# Patient Record
Sex: Female | Born: 1955 | Hispanic: No | Smoking: Never smoker
Health system: Southern US, Community
[De-identification: ages and names within clinical notes are randomized; demographics above are authoritative.]

## PROBLEM LIST (undated history)

## (undated) DIAGNOSIS — F32A Depression, unspecified: Secondary | ICD-10-CM

## (undated) DIAGNOSIS — E785 Hyperlipidemia, unspecified: Secondary | ICD-10-CM

## (undated) DIAGNOSIS — M797 Fibromyalgia: Secondary | ICD-10-CM

## (undated) DIAGNOSIS — E119 Type 2 diabetes mellitus without complications: Secondary | ICD-10-CM

## (undated) DIAGNOSIS — F419 Anxiety disorder, unspecified: Secondary | ICD-10-CM

## (undated) DIAGNOSIS — F329 Major depressive disorder, single episode, unspecified: Secondary | ICD-10-CM

## (undated) DIAGNOSIS — F988 Other specified behavioral and emotional disorders with onset usually occurring in childhood and adolescence: Secondary | ICD-10-CM

## (undated) DIAGNOSIS — E559 Vitamin D deficiency, unspecified: Secondary | ICD-10-CM

## (undated) HISTORY — DX: Major depressive disorder, single episode, unspecified: F32.9

## (undated) HISTORY — DX: Fibromyalgia: M79.7

## (undated) HISTORY — DX: Anxiety disorder, unspecified: F41.9

## (undated) HISTORY — PX: TUBAL LIGATION: SHX77

## (undated) HISTORY — DX: Vitamin D deficiency, unspecified: E55.9

## (undated) HISTORY — DX: Other specified behavioral and emotional disorders with onset usually occurring in childhood and adolescence: F98.8

## (undated) HISTORY — DX: Hyperlipidemia, unspecified: E78.5

## (undated) HISTORY — DX: Depression, unspecified: F32.A

---
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
## 2010-03-28 ENCOUNTER — Other Ambulatory Visit: Admission: RE | Admit: 2010-03-28 | Discharge: 2010-03-28 | Payer: Self-pay | Admitting: Obstetrics and Gynecology

## 2015-04-18 ENCOUNTER — Ambulatory Visit (HOSPITAL_COMMUNITY): Payer: Self-pay | Admitting: Psychiatry

## 2015-05-05 ENCOUNTER — Encounter (HOSPITAL_COMMUNITY): Payer: Self-pay | Admitting: Psychiatry

## 2015-05-05 ENCOUNTER — Ambulatory Visit (INDEPENDENT_AMBULATORY_CARE_PROVIDER_SITE_OTHER): Payer: 59 | Admitting: Psychiatry

## 2015-05-05 VITALS — BP 134/71 | HR 64 | Ht 65.0 in | Wt 161.0 lb

## 2015-05-05 DIAGNOSIS — F331 Major depressive disorder, recurrent, moderate: Secondary | ICD-10-CM

## 2015-05-05 DIAGNOSIS — F909 Attention-deficit hyperactivity disorder, unspecified type: Secondary | ICD-10-CM

## 2015-05-05 DIAGNOSIS — F988 Other specified behavioral and emotional disorders with onset usually occurring in childhood and adolescence: Secondary | ICD-10-CM

## 2015-05-05 MED ORDER — METHYLPHENIDATE HCL 10 MG PO TABS: 10.0000 mg | ORAL_TABLET | Freq: Two times a day (BID) | ORAL | Status: DC

## 2015-05-05 MED ORDER — DULOXETINE HCL 60 MG PO CPEP: 60.0000 mg | ORAL_CAPSULE | Freq: Every day | ORAL | Status: DC
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
## 2015-05-08 ENCOUNTER — Other Ambulatory Visit (HOSPITAL_COMMUNITY): Payer: Self-pay | Admitting: Psychiatry

## 2015-05-08 ENCOUNTER — Telehealth (HOSPITAL_COMMUNITY): Payer: Self-pay | Admitting: *Deleted

## 2015-05-08 MED ORDER — VENLAFAXINE HCL ER 75 MG PO CP24: 75.0000 mg | ORAL_CAPSULE | Freq: Every day | ORAL | Status: DC

## 2015-05-08 NOTE — Telephone Encounter (Signed)
patient states Cymbalta is $40, she would like something cheaper.

## 2015-05-08 NOTE — Telephone Encounter (Signed)
effexor XR sent in

## 2015-05-08 NOTE — Telephone Encounter (Signed)
Patient called and stated her Cymbalta is $40, she would like something cheaper. Pt number is [phone]

## 2015-05-16 ENCOUNTER — Ambulatory Visit (HOSPITAL_COMMUNITY): Payer: Self-pay | Admitting: Psychology

## 2015-06-05 ENCOUNTER — Ambulatory Visit (HOSPITAL_COMMUNITY): Payer: Self-pay | Admitting: Psychiatry

## 2015-06-28 ENCOUNTER — Ambulatory Visit (INDEPENDENT_AMBULATORY_CARE_PROVIDER_SITE_OTHER): Payer: 59 | Admitting: Psychiatry

## 2015-06-28 ENCOUNTER — Encounter (HOSPITAL_COMMUNITY): Payer: Self-pay | Admitting: Psychiatry

## 2015-06-28 VITALS — BP 155/76 | HR 81 | Ht 65.0 in | Wt 164.0 lb

## 2015-06-28 DIAGNOSIS — F988 Other specified behavioral and emotional disorders with onset usually occurring in childhood and adolescence: Secondary | ICD-10-CM

## 2015-06-28 DIAGNOSIS — F909 Attention-deficit hyperactivity disorder, unspecified type: Secondary | ICD-10-CM | POA: Diagnosis not present

## 2015-06-28 MED ORDER — METHYLPHENIDATE HCL 10 MG PO TABS: 10.0000 mg | ORAL_TABLET | Freq: Two times a day (BID) | ORAL | Status: DC

## 2015-06-28 MED ORDER — FLUOXETINE HCL 20 MG PO CAPS: 20.0000 mg | ORAL_CAPSULE | Freq: Every day | ORAL | Status: DC
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
## 2015-08-03 ENCOUNTER — Telehealth (HOSPITAL_COMMUNITY): Payer: Self-pay | Admitting: *Deleted

## 2015-08-28 ENCOUNTER — Ambulatory Visit (HOSPITAL_COMMUNITY): Payer: Self-pay | Admitting: Psychiatry

## 2015-08-29 ENCOUNTER — Ambulatory Visit (HOSPITAL_COMMUNITY): Payer: Self-pay | Admitting: Psychiatry

## 2015-09-22 ENCOUNTER — Encounter: Payer: Self-pay | Admitting: Cardiology

## 2015-09-22 ENCOUNTER — Ambulatory Visit (INDEPENDENT_AMBULATORY_CARE_PROVIDER_SITE_OTHER): Payer: Medicare HMO | Admitting: Cardiology

## 2015-09-22 VITALS — BP 139/76 | HR 81 | Ht 65.0 in | Wt 164.6 lb

## 2015-09-22 DIAGNOSIS — R011 Cardiac murmur, unspecified: Secondary | ICD-10-CM

## 2015-09-22 DIAGNOSIS — R002 Palpitations: Secondary | ICD-10-CM

## 2015-09-22 DIAGNOSIS — Q249 Congenital malformation of heart, unspecified: Secondary | ICD-10-CM

## 2015-09-22 DIAGNOSIS — R0789 Other chest pain: Secondary | ICD-10-CM | POA: Diagnosis not present

## 2015-09-22 NOTE — Progress Notes (Signed)
Cardiology Office Note  Date: 09/22/2015   ID: Beverly Contreras, DOB 12-05-1955, MRN [id number]  PCP: Glenda Chroman., MD  Consulting Cardiologist: Rozann Lesches, MD   Chief Complaint  Patient presents with  . Chest Pain    History of Present Illness: Beverly Contreras is a 60 y.o. female referred for cardiology consultation by Dr. Woody Seller.  She reports having an intermittent feeling of "jitteriness" with occasional palpitations, seems to be worse when she is still or lying down at night time. This is less notes all during the daytime. She also experiences an atypical "needlelike" sensation in her left lower chest. She has not had any syncope with these symptoms. No reproducible exertional chest discomfort. She does tell me that she has suffered a lot of loss over the last 14 months - her father, mother, and daughter have all died during this time.  I reviewed her chart. In addition to follow-up with Dr. Woody Seller, she also sees Dr. Harrington Challenger in [place] for psychiatric assessment.  We discussed her medications which are outlined below. No obvious change in regimen related to her current symptoms that she can pinpoint.  She tells me that she underwent cardiac surgery at [facility] back in 1969 related to a "hole in the heart." I do not have any other details. She states that she has had no specific follow-up for this since that time, no recent echocardiogram.  Past Medical History  Diagnosis Date  . Fibromyalgia   . Hyperlipidemia   . Vitamin D deficiency   . Depression   . Attention deficit disorder   . Anxiety     Past Surgical History  Procedure Laterality Date  . Cardiac surgery  1969    [facility], reportedly to repair "hole in heart"  . Tubal ligation      Current Outpatient Prescriptions  Medication Sig Dispense Refill  . clonazePAM (KLONOPIN) 0.5 MG tablet Take 0.5 mg by mouth as needed for anxiety.    . diclofenac (VOLTAREN) 75 MG EC tablet Take 75 mg by mouth 2 (two)  times daily.     Marland Kitchen FLUoxetine (PROZAC) 20 MG capsule Take 1 capsule (20 mg total) by mouth daily. 30 capsule 2  . HYDROcodone-acetaminophen (NORCO/VICODIN) 5-325 MG per tablet Take 1 tablet by mouth as needed.     . methylphenidate (RITALIN) 10 MG tablet Take 1 tablet (10 mg total) by mouth 2 (two) times daily with breakfast and lunch. 60 tablet 0  . tiZANidine (ZANAFLEX) 4 MG tablet Take 4 mg by mouth 2 (two) times daily.      No current facility-administered medications for this visit.   Allergies:  Levaquin   Social History: The patient  reports that she has never smoked. She does not have any smokeless tobacco history on file. She reports that she does not drink alcohol or use illicit drugs.   Family History: The patient's family history includes ADD / ADHD in her other; Alcohol abuse in her brother and brother; Bipolar disorder in her daughter; Drug abuse in her brother, brother, and other; Heart disease in her father; Hypertension in her father; Schizophrenia in her brother; Valvular heart disease in her mother.   ROS:  Please see the history of present illness. Otherwise, complete review of systems is positive for anxiety, fatigue.  All other systems are reviewed and negative.   Physical Exam: VS:  BP 139/76 mmHg  Pulse 81  Ht 5\' 5"  (1.651 m)  Wt 164 lb 9.6 oz (74.662 kg)  BMI 27.39 kg/m2  SpO2 97%, BMI Body mass index is 27.39 kg/(m^2).  Wt Readings from Last 3 Encounters:  09/22/15 164 lb 9.6 oz (74.662 kg)  06/28/15 164 lb (74.39 kg)  05/05/15 161 lb (73.029 kg)    General: Patient in no distress. HEENT: Conjunctiva and lids normal, oropharynx clear. Neck: Supple, no elevated JVP or carotid bruits, no thyromegaly. Lungs: Clear to auscultation, nonlabored breathing at rest. Thorax: T-shaped incision, well-healed Cardiac: Regular rate and rhythm, no S3, soft systolic murmur at base, no pericardial rub. Abdomen: Soft, nontender, bowel sounds present, no guarding or  rebound. Extremities: No pitting edema, distal pulses 2+. Skin: Warm and dry. Musculoskeletal: No kyphosis. Neuropsychiatric: Alert and oriented x3, affect grossly appropriate.  ECG: Tracing from 09/05/2015 showed sinus rhythm with R' in lead V1.  Assessment and Plan:  1. Reported feeling of intermittent jitteriness and palpitations, also atypical chest discomfort. Symptoms are not particularly suggestive of an ischemic etiology. She has a soft systolic murmur on examination. With prior history of reported congenital heart disease and cardiac surgery in 1969, we will obtain an echocardiogram to assess cardiac structure and function. Also 7 day event recorder to further investigate her palpitations and assess for any potential arrhythmias.  2. History of depression and attention deficit disorder. She is followed by behavioral health. Also reporting grieving over the last year with loss of several close family members.  3. Hyperlipidemia by history , currently not on any specific medical therapy. She follows with Dr. Woody Seller.  Current medicines were reviewed with the patient today.   Orders Placed This Encounter  Procedures  . Cardiac event monitor  . ECHOCARDIOGRAM COMPLETE    Disposition: Call with results.  Signed, Satira Sark, MD, [facility] 09/22/2015 9:11 AM    [facility] [place] [address], [place], [place] [address] Phone: [phone]; Fax: [phone]

## 2015-09-22 NOTE — Patient Instructions (Signed)
Your physician recommends that you continue on your current medications as directed. Please refer to the Current Medication list given to you today. Your physician has recommended that you wear a 7 day event monitor. Event monitors are medical devices that record the heart's electrical activity. Doctors most often [place] these monitors to diagnose arrhythmias. Arrhythmias are problems with the speed or rhythm of the heartbeat. The monitor is a small, portable device. You can wear one while you do your normal daily activities. This is usually used to diagnose what is causing palpitations/syncope (passing out). [facility] will contact you directly about this monitor. Your physician has requested that you have an echocardiogram. Echocardiography is a painless test that uses sound waves to create images of your heart. It provides your doctor with information about the size and shape of your heart and how well your heart's chambers and valves are working. This procedure takes approximately one hour. There are no restrictions for this procedure. We will call you with your results.

## 2015-09-27 ENCOUNTER — Ambulatory Visit (INDEPENDENT_AMBULATORY_CARE_PROVIDER_SITE_OTHER): Payer: Medicare HMO

## 2015-09-27 ENCOUNTER — Other Ambulatory Visit: Payer: Self-pay

## 2015-09-27 DIAGNOSIS — R011 Cardiac murmur, unspecified: Secondary | ICD-10-CM

## 2015-09-27 DIAGNOSIS — R002 Palpitations: Secondary | ICD-10-CM | POA: Diagnosis not present

## 2015-09-27 DIAGNOSIS — Q249 Congenital malformation of heart, unspecified: Secondary | ICD-10-CM | POA: Diagnosis not present

## 2015-09-29 ENCOUNTER — Telehealth: Payer: Self-pay | Admitting: *Deleted

## 2015-09-29 NOTE — Telephone Encounter (Signed)
-----   Message from Satira Sark, MD sent at 09/27/2015 10:57 AM EST ----- Study reviewed. I do not have records regarding her reported childhood heart surgery. 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 Her heart murmur is likely related to the aortic valve which does show some calcification and restricted leaflet motion, but no aortic stenosis and only mild aortic regurgitation. None of these findings should necessarily be symptom provoking at this time.

## 2015-10-03 NOTE — Telephone Encounter (Signed)
Patient informed. 

## 2015-10-06 ENCOUNTER — Telehealth: Payer: Self-pay | Admitting: *Deleted

## 2015-10-06 ENCOUNTER — Ambulatory Visit (HOSPITAL_COMMUNITY): Payer: Self-pay | Admitting: Psychiatry

## 2015-10-06 ENCOUNTER — Telehealth (HOSPITAL_COMMUNITY): Payer: Self-pay | Admitting: *Deleted

## 2015-10-06 NOTE — Telephone Encounter (Signed)
Called pt and lmtcb to cancel appt for 10-06-15 due to provider being out of the office sick. Number provided

## 2015-10-06 NOTE — Telephone Encounter (Signed)
-----   Message from Satira Sark, MD sent at 10/05/2015  9:59 AM EST ----- Reviewed. Please let her know that the heart monitor did not uncover any significant heart rhythm abnormalities.

## 2015-10-06 NOTE — Telephone Encounter (Signed)
Patient informed. 

## 2015-10-11 ENCOUNTER — Telehealth: Payer: Self-pay | Admitting: Cardiology

## 2015-10-11 NOTE — Telephone Encounter (Signed)
Mrs. Beverly Contreras called today stating that she is still having some CP off and on. She is wanting to know if she is suppose to  Come back and see Dr. Domenic Polite.

## 2015-10-11 NOTE — Telephone Encounter (Signed)
Spoke with patient and she said that she has not been having any symptoms of chest pain since prior to sending her event monitor back. Nurse confirmed with patient again regarding the mentioned chest pain below, and patient said that she is not having any problems at this time. Patient denies chest pain, dizziness or sob. Patient wanted to know if she could come to the office for regular follow up visits since she wasn't given a return appointment. Patient advised that she could always call for an appointment as needed and that we could also add her to the recall list if MD feels its appropriate. Patient verbalized understanding of plan.

## 2015-10-27 ENCOUNTER — Telehealth (HOSPITAL_COMMUNITY): Payer: Self-pay | Admitting: *Deleted

## 2015-10-27 ENCOUNTER — Encounter (HOSPITAL_COMMUNITY): Payer: Self-pay | Admitting: Psychiatry

## 2015-10-27 ENCOUNTER — Ambulatory Visit (INDEPENDENT_AMBULATORY_CARE_PROVIDER_SITE_OTHER): Payer: Self-pay | Admitting: Psychiatry

## 2015-10-27 VITALS — BP 123/66 | HR 82 | Ht 60.0 in | Wt 163.0 lb

## 2015-10-27 DIAGNOSIS — F331 Major depressive disorder, recurrent, moderate: Secondary | ICD-10-CM

## 2015-10-27 DIAGNOSIS — F988 Other specified behavioral and emotional disorders with onset usually occurring in childhood and adolescence: Secondary | ICD-10-CM

## 2015-10-27 DIAGNOSIS — F909 Attention-deficit hyperactivity disorder, unspecified type: Secondary | ICD-10-CM

## 2015-10-27 MED ORDER — FLUOXETINE HCL 20 MG PO CAPS: 20.0000 mg | ORAL_CAPSULE | Freq: Every day | ORAL | Status: DC

## 2015-10-27 MED ORDER — CLONAZEPAM 0.5 MG PO TABS: 0.5000 mg | ORAL_TABLET | ORAL | Status: DC | PRN

## 2015-10-27 MED ORDER — METHYLPHENIDATE HCL 10 MG PO TABS: 10.0000 mg | ORAL_TABLET | Freq: Every day | ORAL | Status: DC

## 2015-10-27 MED ORDER — METHYLPHENIDATE HCL 10 MG PO TABS: 10.0000 mg | ORAL_TABLET | Freq: Two times a day (BID) | ORAL | Status: DC
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
## 2016-01-24 ENCOUNTER — Ambulatory Visit (HOSPITAL_COMMUNITY): Payer: Self-pay | Admitting: Psychiatry

## 2016-05-31 ENCOUNTER — Other Ambulatory Visit (HOSPITAL_COMMUNITY): Payer: Self-pay | Admitting: Psychiatry

## 2016-06-03 ENCOUNTER — Telehealth (HOSPITAL_COMMUNITY): Payer: Self-pay | Admitting: *Deleted

## 2016-06-03 NOTE — Telephone Encounter (Signed)
Opened in Error.

## 2016-08-27 ENCOUNTER — Ambulatory Visit (INDEPENDENT_AMBULATORY_CARE_PROVIDER_SITE_OTHER): Payer: Self-pay | Admitting: Psychiatry

## 2016-08-27 ENCOUNTER — Encounter (HOSPITAL_COMMUNITY): Payer: Self-pay | Admitting: Psychiatry

## 2016-08-27 VITALS — BP 139/80 | HR 86 | Ht 60.0 in | Wt 167.4 lb

## 2016-08-27 DIAGNOSIS — Z813 Family history of other psychoactive substance abuse and dependence: Secondary | ICD-10-CM

## 2016-08-27 DIAGNOSIS — F900 Attention-deficit hyperactivity disorder, predominantly inattentive type: Secondary | ICD-10-CM

## 2016-08-27 DIAGNOSIS — Z9889 Other specified postprocedural states: Secondary | ICD-10-CM

## 2016-08-27 DIAGNOSIS — Z8249 Family history of ischemic heart disease and other diseases of the circulatory system: Secondary | ICD-10-CM

## 2016-08-27 DIAGNOSIS — Z818 Family history of other mental and behavioral disorders: Secondary | ICD-10-CM

## 2016-08-27 DIAGNOSIS — Z811 Family history of alcohol abuse and dependence: Secondary | ICD-10-CM

## 2016-08-27 MED ORDER — FLUOXETINE HCL 20 MG PO CAPS: 20.0000 mg | ORAL_CAPSULE | Freq: Every day | ORAL | 2 refills | Status: AC

## 2016-08-27 MED ORDER — CLONAZEPAM 0.5 MG PO TABS: 0.5000 mg | ORAL_TABLET | ORAL | 2 refills | Status: AC | PRN

## 2016-08-27 MED ORDER — METHYLPHENIDATE HCL 10 MG PO TABS: 10.0000 mg | ORAL_TABLET | Freq: Two times a day (BID) | ORAL | 0 refills | Status: DC

## 2016-08-27 MED ORDER — METHYLPHENIDATE HCL 10 MG PO TABS: 10.0000 mg | ORAL_TABLET | Freq: Two times a day (BID) | ORAL | 0 refills | Status: AC
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
## 2016-09-25 DIAGNOSIS — I10 Essential (primary) hypertension: Secondary | ICD-10-CM | POA: Diagnosis not present

## 2016-09-25 DIAGNOSIS — R69 Illness, unspecified: Secondary | ICD-10-CM | POA: Diagnosis not present

## 2016-09-25 DIAGNOSIS — J069 Acute upper respiratory infection, unspecified: Secondary | ICD-10-CM | POA: Diagnosis not present

## 2016-09-25 DIAGNOSIS — Z299 Encounter for prophylactic measures, unspecified: Secondary | ICD-10-CM | POA: Diagnosis not present

## 2016-09-25 DIAGNOSIS — M797 Fibromyalgia: Secondary | ICD-10-CM | POA: Diagnosis not present

## 2016-10-16 DIAGNOSIS — Z299 Encounter for prophylactic measures, unspecified: Secondary | ICD-10-CM | POA: Diagnosis not present

## 2016-10-16 DIAGNOSIS — Z789 Other specified health status: Secondary | ICD-10-CM | POA: Diagnosis not present

## 2016-10-16 DIAGNOSIS — J329 Chronic sinusitis, unspecified: Secondary | ICD-10-CM | POA: Diagnosis not present

## 2016-11-25 ENCOUNTER — Ambulatory Visit (HOSPITAL_COMMUNITY): Payer: Self-pay | Admitting: Psychiatry

## 2016-11-25 ENCOUNTER — Encounter (HOSPITAL_COMMUNITY): Payer: Self-pay

## 2016-11-25 ENCOUNTER — Telehealth (HOSPITAL_COMMUNITY): Payer: Self-pay | Admitting: *Deleted

## 2016-11-25 NOTE — Telephone Encounter (Signed)
Called pt to resch appt./.../or lmtcb

## 2016-12-05 DIAGNOSIS — Z1231 Encounter for screening mammogram for malignant neoplasm of breast: Secondary | ICD-10-CM | POA: Diagnosis not present

## 2017-01-08 DIAGNOSIS — Z1211 Encounter for screening for malignant neoplasm of colon: Secondary | ICD-10-CM | POA: Diagnosis not present

## 2017-01-08 DIAGNOSIS — E7800 Pure hypercholesterolemia, unspecified: Secondary | ICD-10-CM | POA: Diagnosis not present

## 2017-01-08 DIAGNOSIS — Z299 Encounter for prophylactic measures, unspecified: Secondary | ICD-10-CM | POA: Diagnosis not present

## 2017-01-08 DIAGNOSIS — M779 Enthesopathy, unspecified: Secondary | ICD-10-CM | POA: Diagnosis not present

## 2017-01-08 DIAGNOSIS — Z79899 Other long term (current) drug therapy: Secondary | ICD-10-CM | POA: Diagnosis not present

## 2017-01-08 DIAGNOSIS — Z0000 Encounter for general adult medical examination without abnormal findings: Secondary | ICD-10-CM | POA: Diagnosis not present

## 2017-01-08 DIAGNOSIS — Z7189 Other specified counseling: Secondary | ICD-10-CM | POA: Diagnosis not present

## 2017-01-08 DIAGNOSIS — R69 Illness, unspecified: Secondary | ICD-10-CM | POA: Diagnosis not present

## 2017-01-08 DIAGNOSIS — Z01419 Encounter for gynecological examination (general) (routine) without abnormal findings: Secondary | ICD-10-CM | POA: Diagnosis not present

## 2017-01-08 DIAGNOSIS — M797 Fibromyalgia: Secondary | ICD-10-CM | POA: Diagnosis not present

## 2017-01-08 DIAGNOSIS — Z1389 Encounter for screening for other disorder: Secondary | ICD-10-CM | POA: Diagnosis not present

## 2017-01-20 DIAGNOSIS — L820 Inflamed seborrheic keratosis: Secondary | ICD-10-CM | POA: Diagnosis not present

## 2017-01-20 DIAGNOSIS — L570 Actinic keratosis: Secondary | ICD-10-CM | POA: Diagnosis not present

## 2017-01-20 DIAGNOSIS — R739 Hyperglycemia, unspecified: Secondary | ICD-10-CM | POA: Diagnosis not present

## 2017-01-20 DIAGNOSIS — Z6827 Body mass index (BMI) 27.0-27.9, adult: Secondary | ICD-10-CM | POA: Diagnosis not present

## 2017-01-20 DIAGNOSIS — Z713 Dietary counseling and surveillance: Secondary | ICD-10-CM | POA: Diagnosis not present

## 2017-01-20 DIAGNOSIS — Z299 Encounter for prophylactic measures, unspecified: Secondary | ICD-10-CM | POA: Diagnosis not present

## 2017-05-25 NOTE — Progress Notes (Signed)
Cardiology Office Note  Date: 05/26/2017   ID: Beverly Contreras, DOB 11/17/1955, MRN [id number]  PCP: Glenda Chroman, MD  Primary Cardiologist: Rozann Lesches, MD   Chief Complaint  Patient presents with  . Chest Pain    History of Present Illness: Beverly Contreras is a 61 y.o. female that I met in consultation back in January 2017. She presents to the office today for follow-up stating that she has had intermittent episodes of chest pain over the last several months. She points to different places in her chest, states that sometimes it feels like a stinging pain, other times like a tightness. She has been fatigued and had some exertional chest pain in the last few weeks. Vague sense of palpitations intermittently but no syncope. She has no defined history of obstructive CAD. Some history of heart disease in the family.  Echocardiogram and event monitor from last year are outlined below. We went over the results today.  I personally reviewed her ECG today which shows sinus rhythm with R' in lead V1 and V2 as well as possible left atrial enlargement.  She reports no other major health changes and continues on the medications outlined below. She is raising a 76-year-old granddaughter, and has dealt with a lot of deaths in her family over the last 2 years.  Past Medical History:  Diagnosis Date  . Anxiety   . Attention deficit disorder   . Depression   . Fibromyalgia   . Hyperlipidemia   . Vitamin D deficiency     Past Surgical History:  Procedure Laterality Date  . [place], reportedly to repair "hole in heart"  . TUBAL LIGATION      Current Outpatient Prescriptions  Medication Sig Dispense Refill  . clonazePAM (KLONOPIN) 0.5 MG tablet Take 1 tablet (0.5 mg total) by mouth as needed for anxiety. 30 tablet 2  . diclofenac (VOLTAREN) 75 MG EC tablet Take 75 mg by mouth 2 (two) times daily.     Marland Kitchen FLUoxetine (PROZAC) 20 MG capsule Take 1 capsule (20 mg  total) by mouth daily. 30 capsule 2  . methylphenidate (RITALIN) 10 MG tablet Take 1 tablet (10 mg total) by mouth 2 (two) times daily with breakfast and lunch. 60 tablet 0  . tiZANidine (ZANAFLEX) 4 MG tablet Take 4 mg by mouth 2 (two) times daily.      No current facility-administered medications for this visit.    Allergies:  Levaquin [levofloxacin in d5w]   Social History: The patient  reports that she has never smoked. She has never used smokeless tobacco. She reports that she does not drink alcohol or use drugs.   ROS:  Please see the history of present illness. Otherwise, complete review of systems is positive for stress and anxiety.  All other systems are reviewed and negative.   Physical Exam: VS:  BP 122/80   Pulse 87   Ht 5' 5"  (1.651 m)   Wt 164 lb 6.4 oz (74.6 kg)   SpO2 98%   BMI 27.36 kg/m , BMI Body mass index is 27.36 kg/m.  Wt Readings from Last 3 Encounters:  05/26/17 164 lb 6.4 oz (74.6 kg)  09/22/15 164 lb 9.6 oz (74.7 kg)    General: Patient in no distress. HEENT: Conjunctiva and lids normal, oropharynx clear. Neck: Supple, no elevated JVP or carotid bruits, no thyromegaly. Lungs: Clear to auscultation, nonlabored breathing at rest. Thorax: T-shaped incision, well-healed Cardiac: Regular rate and rhythm,  no S3, soft systolic murmur at base, no pericardial rub. Abdomen: Soft, nontender, bowel sounds present, no guarding or rebound. Extremities: No pitting edema, distal pulses 2+. Skin: Warm and dry. Musculoskeletal: No kyphosis. Neuropsychiatric: Alert and oriented x3, affect grossly appropriate.  ECG: I personally reviewed the tracing from 09/05/2015 showed sinus rhythm with R' in lead V1.  Other Studies Reviewed Today:  Event recorder 09/28/2015: 7 day event recorder reviewed. Sinus rhythm noted throughout with intermittent lead artifact and lead loss.  Echocardiogram 09/27/2015: Study Conclusions  - Left ventricle: The cavity size was normal.  Wall thickness was   increased in a pattern of mild LVH. Systolic function was normal.   The estimated ejection fraction was in the range of 55% to 60%.   Wall motion was normal; there were no regional wall motion   abnormalities. Doppler parameters are consistent with abnormal   left ventricular relaxation (grade 1 diastolic dysfunction). - Aortic valve: Not well visualized. Probably trileaflet; mildly   calcified leaflets. There looks to be bowing and restricted   motion of the left coronary cusp, but no obvious stenosis. There   was mild regurgitation. - Mitral valve: Calcified annulus. There was trivial regurgitation. - Left atrium: The atrium was at the upper limits of normal in   size. - Right atrium: The atrium was at the upper limits of normal in   size. Central venous pressure (est): 3 mm Hg. - Atrial septum: Increased echogenicity suggesting mild thickening.   Cannot completely exclude a trivial degree of left-to-right shunt   through PFO, seen only intermittently by color Doppler. There is   no large ASD evident however. - Tricuspid valve: There was trivial regurgitation. - Pulmonary arteries: PA peak pressure: 28 mm Hg (S). - Pericardium, extracardiac: A prominent pericardial fat pad was   present.  Impressions:  - Mildly increased LV wall thickness with LVEF 55-60%. Grade 1   diastolic dysfunction with normal filling pressures. Upper normal   left and right atrial chamber size. Mitral annular calcification   with trivial mitral regurgitation. Aortic valve is not well seen   but appears to be trileaflet and mildly calcified with bowing and   some restrictive motion of the left coronary cusp. No aortic   stenosis. Mild aortic regurgitation. There is thickening noted of   the interatrial septum with perhaps a trivial degree of   left-to-right shunting by PFO, but no obvious ASD. PASP is normal   at 28 mmHg.  Assessment and Plan:  1. Atypical chest pain as well as  fatigue and at times exertional chest discomfort. She has no defined history of obstructive CAD. ECG is nonspecific as outlined above. Cardiac risk factors include hyperlipidemia and family history of CAD. We will obtain an exercise Myoview for further evaluation.  2. History of apparent ASD repair in the past. Follow-up echocardiogram last year demonstrated thickening of the interatrial septum with a trivial degree of left-to-right shunting in region of PFO. Right ventricular contraction was normal as well as PASP.  3. History of palpitations, no syncope. Cardiac monitor from last year did not demonstrate any arrhythmias. Could also be associated with use of Ritalin.  4. History of hyperlipidemia, managed by diet. She follows with Dr. Woody Seller.  Current medicines were reviewed with the patient today.   Orders Placed This Encounter  Procedures  . NM Myocar Multi W/Spect W/Wall Motion / EF  . EKG 12-Lead    Disposition: Call with test results.  Signed, Satira Sark, MD,  [facility] 05/26/2017 1:30 PM    [facility] at [place], [place], [place] [address] Phone: [phone]; Fax: [phone]

## 2017-05-26 ENCOUNTER — Encounter: Payer: Self-pay | Admitting: *Deleted

## 2017-05-26 ENCOUNTER — Encounter: Payer: Self-pay | Admitting: Cardiology

## 2017-05-26 ENCOUNTER — Ambulatory Visit (INDEPENDENT_AMBULATORY_CARE_PROVIDER_SITE_OTHER): Payer: Medicare HMO | Admitting: Cardiology

## 2017-05-26 VITALS — BP 122/80 | HR 87 | Ht 65.0 in | Wt 164.4 lb

## 2017-05-26 DIAGNOSIS — R002 Palpitations: Secondary | ICD-10-CM

## 2017-05-26 DIAGNOSIS — R0789 Other chest pain: Secondary | ICD-10-CM | POA: Diagnosis not present

## 2017-05-26 DIAGNOSIS — Q2112 Patent foramen ovale: Secondary | ICD-10-CM

## 2017-05-26 DIAGNOSIS — E782 Mixed hyperlipidemia: Secondary | ICD-10-CM | POA: Diagnosis not present

## 2017-05-26 DIAGNOSIS — Q211 Atrial septal defect: Secondary | ICD-10-CM

## 2017-05-26 NOTE — Patient Instructions (Signed)
Your physician recommends that you schedule a follow-up appointment TO BE DETERMINED WITH DR Ar[place]  Your physician recommends that you continue on your current medications as directed. Please refer to the Current Medication list given to you today.  Your physician has requested that you have en exercise stress myoview. For further information please visit [email or web address]. Please follow instruction sheet, as given.  Thank you for choosing [facility]!!

## 2017-05-28 ENCOUNTER — Telehealth: Payer: Self-pay | Admitting: Cardiology

## 2017-05-28 NOTE — Telephone Encounter (Signed)
Pre-cert Verification for the following procedure   Exercise Myoview scheduled for 06/03/2017

## 2017-06-03 ENCOUNTER — Inpatient Hospital Stay (HOSPITAL_COMMUNITY): Admission: RE | Admit: 2017-06-03 | Payer: Self-pay | Source: Ambulatory Visit

## 2017-06-03 ENCOUNTER — Encounter (HOSPITAL_COMMUNITY): Payer: Medicare HMO

## 2017-06-09 ENCOUNTER — Encounter (HOSPITAL_BASED_OUTPATIENT_CLINIC_OR_DEPARTMENT_OTHER)
Admission: RE | Admit: 2017-06-09 | Discharge: 2017-06-09 | Disposition: A | Discharge status: Home / Self Care | Payer: Medicare HMO | Source: Ambulatory Visit | Attending: Cardiology | Admitting: Cardiology

## 2017-06-09 ENCOUNTER — Encounter (HOSPITAL_COMMUNITY)
Admission: RE | Admit: 2017-06-09 | Discharge: 2017-06-09 | Disposition: A | Discharge status: Home / Self Care | Payer: Medicare HMO | Source: Ambulatory Visit | Attending: Cardiology | Admitting: Cardiology

## 2017-06-09 ENCOUNTER — Encounter (HOSPITAL_COMMUNITY): Payer: Self-pay

## 2017-06-09 DIAGNOSIS — W01198A Fall on same level from slipping, tripping and stumbling with subsequent striking against other object, initial encounter: Secondary | ICD-10-CM | POA: Diagnosis not present

## 2017-06-09 DIAGNOSIS — M79674 Pain in right toe(s): Secondary | ICD-10-CM | POA: Diagnosis not present

## 2017-06-09 DIAGNOSIS — S99921A Unspecified injury of right foot, initial encounter: Secondary | ICD-10-CM | POA: Diagnosis not present

## 2017-06-09 DIAGNOSIS — R0789 Other chest pain: Secondary | ICD-10-CM

## 2017-06-09 DIAGNOSIS — S92355A Nondisplaced fracture of fifth metatarsal bone, left foot, initial encounter for closed fracture: Secondary | ICD-10-CM | POA: Diagnosis not present

## 2017-06-09 DIAGNOSIS — W19XXXA Unspecified fall, initial encounter: Secondary | ICD-10-CM | POA: Diagnosis not present

## 2017-06-09 DIAGNOSIS — R69 Illness, unspecified: Secondary | ICD-10-CM | POA: Diagnosis not present

## 2017-06-09 DIAGNOSIS — M797 Fibromyalgia: Secondary | ICD-10-CM | POA: Diagnosis not present

## 2017-06-09 DIAGNOSIS — Z79899 Other long term (current) drug therapy: Secondary | ICD-10-CM | POA: Diagnosis not present

## 2017-06-09 LAB — NM MYOCAR MULTI W/SPECT W/WALL MOTION / EF
CHL CUP RESTING HR STRESS: 80 {beats}/min
CHL RATE OF PERCEIVED EXERTION: 13
CSEPEDS: 36 s
Estimated workload: 7 METS
Exercise duration (min): 6 min
LVDIAVOL: 49 mL (ref 46–106)
LVSYSVOL: 19 mL
MPHR: 158 {beats}/min
NUC STRESS TID: 0.95
Peak HR: 137 {beats}/min
Percent HR: 86 %
RATE: 0.39
SDS: 4
SRS: 1
SSS: 5

## 2017-06-09 MED ORDER — REGADENOSON 0.4 MG/5ML IV SOLN
INTRAVENOUS | Status: AC
  Filled 2017-06-09: qty 5

## 2017-06-09 MED ORDER — SODIUM CHLORIDE 0.9% FLUSH
INTRAVENOUS | Status: AC
  Administered 2017-06-09: 10 mL via INTRAVENOUS
  Filled 2017-06-09: qty 10

## 2017-06-09 MED ORDER — TECHNETIUM TC 99M TETROFOSMIN IV KIT
30.0000 | PACK | Freq: Once | INTRAVENOUS | Status: AC | PRN
  Administered 2017-06-09: 30 via INTRAVENOUS

## 2017-06-09 MED ORDER — TECHNETIUM TC 99M TETROFOSMIN IV KIT
10.0000 | PACK | Freq: Once | INTRAVENOUS | Status: AC | PRN
  Administered 2017-06-09: 10 via INTRAVENOUS

## 2017-06-10 ENCOUNTER — Telehealth: Payer: Self-pay

## 2017-06-10 NOTE — Telephone Encounter (Signed)
Patient notified and verbalized understanding. Appointment scheduled for Friday 9/27. Routed to PCP

## 2017-06-10 NOTE — Telephone Encounter (Signed)
-----   Message from Acquanetta Chain, LPN sent at 7/97/2820  7:53 AM EDT -----   ----- Message ----- From: Satira Sark, MD Sent: 06/09/2017   3:16 PM To: Merlene Laughter, LPN  Results reviewed. I reviewed the images as well. Stress testing demonstrated abnormal ST segment changes by ECG and also a region of inferolateral ischemia, normal LVEF. In light of her symptoms and these findings, recommend having her follow-up in the office to discuss diagnostic cardiac catheterization to best clarify her coronary anatomy. Schedule an APP visit or visit with me, whichever is sooner. A copy of this test should be forwarded to Glenda Chroman, MD.

## 2017-06-10 NOTE — Telephone Encounter (Signed)
LMTCB

## 2017-06-10 NOTE — Telephone Encounter (Signed)
-----   Message from Acquanetta Chain, LPN sent at 02/07/8184  7:53 AM EDT -----   ----- Message ----- From: Satira Sark, MD Sent: 06/09/2017   3:16 PM To: Merlene Laughter, LPN  Results reviewed. I reviewed the images as well. Stress testing demonstrated abnormal ST segment changes by ECG and also a region of inferolateral ischemia, normal LVEF. In light of her symptoms and these findings, recommend having her follow-up in the office to discuss diagnostic cardiac catheterization to best clarify her coronary anatomy. Schedule an APP visit or visit with me, whichever is sooner. A copy of this test should be forwarded to Glenda Chroman, MD.

## 2017-06-12 ENCOUNTER — Encounter: Payer: Self-pay | Admitting: *Deleted

## 2017-06-12 DIAGNOSIS — E7800 Pure hypercholesterolemia, unspecified: Secondary | ICD-10-CM | POA: Diagnosis not present

## 2017-06-12 DIAGNOSIS — Z6827 Body mass index (BMI) 27.0-27.9, adult: Secondary | ICD-10-CM | POA: Diagnosis not present

## 2017-06-12 DIAGNOSIS — M797 Fibromyalgia: Secondary | ICD-10-CM | POA: Diagnosis not present

## 2017-06-12 DIAGNOSIS — R69 Illness, unspecified: Secondary | ICD-10-CM | POA: Diagnosis not present

## 2017-06-12 DIAGNOSIS — I10 Essential (primary) hypertension: Secondary | ICD-10-CM | POA: Diagnosis not present

## 2017-06-12 DIAGNOSIS — Z299 Encounter for prophylactic measures, unspecified: Secondary | ICD-10-CM | POA: Diagnosis not present

## 2017-06-12 DIAGNOSIS — S92355A Nondisplaced fracture of fifth metatarsal bone, left foot, initial encounter for closed fracture: Secondary | ICD-10-CM | POA: Diagnosis not present

## 2017-06-13 ENCOUNTER — Ambulatory Visit (INDEPENDENT_AMBULATORY_CARE_PROVIDER_SITE_OTHER): Payer: Medicare HMO | Admitting: Cardiology

## 2017-06-13 ENCOUNTER — Telehealth: Payer: Self-pay | Admitting: Cardiology

## 2017-06-13 ENCOUNTER — Other Ambulatory Visit: Payer: Self-pay | Admitting: Cardiology

## 2017-06-13 ENCOUNTER — Encounter: Payer: Self-pay | Admitting: Cardiology

## 2017-06-13 ENCOUNTER — Encounter: Payer: Self-pay | Admitting: *Deleted

## 2017-06-13 VITALS — BP 132/78 | HR 87 | Ht 65.0 in | Wt 165.6 lb

## 2017-06-13 DIAGNOSIS — E782 Mixed hyperlipidemia: Secondary | ICD-10-CM

## 2017-06-13 DIAGNOSIS — R9439 Abnormal result of other cardiovascular function study: Secondary | ICD-10-CM | POA: Diagnosis not present

## 2017-06-13 DIAGNOSIS — R002 Palpitations: Secondary | ICD-10-CM | POA: Diagnosis not present

## 2017-06-13 DIAGNOSIS — Z8774 Personal history of (corrected) congenital malformations of heart and circulatory system: Secondary | ICD-10-CM | POA: Diagnosis not present

## 2017-06-13 DIAGNOSIS — R0789 Other chest pain: Secondary | ICD-10-CM | POA: Diagnosis not present

## 2017-06-13 NOTE — Progress Notes (Signed)
Cardiology Office Note  Date: 06/13/2017   ID: Beverly Contreras, DOB Jun 25, 1956, MRN [id number]  PCP: Glenda Chroman, MD  Primary Cardiologist: Rozann Lesches, MD   Chief Complaint  Patient presents with  . Follow-up testing    History of Present Illness: Beverly Contreras is a 61 y.o. female seen recently on September 10 and referred for follow-up stress testing. She has had fairly atypical chest pain that continues to recur. No definite trigger that she can identify. Also a vague sense of palpitations but no dizziness or syncope. She has a history of heart disease in the family.  Myoview study demonstrated abnormal ST segment changes and a region of inferolateral ischemia with overall normal LVEF. We discussed the results today.  In light of recurring, although atypical chest pain, we discussed arranging a diagnostic cardiac catheterization to best clarify her coronary anatomy. We reviewed the risks and benefits, and she is in agreement to proceed.  I reviewed her medications which are outlined below.  Past Medical History:  Diagnosis Date  . Anxiety   . Attention deficit disorder   . Depression   . Fibromyalgia   . Hyperlipidemia   . Vitamin D deficiency     Past Surgical History:  Procedure Laterality Date  . [place], reportedly to repair "hole in heart"  . TUBAL LIGATION      Current Outpatient Prescriptions  Medication Sig Dispense Refill  . clonazePAM (KLONOPIN) 0.5 MG tablet Take 1 tablet (0.5 mg total) by mouth as needed for anxiety. 30 tablet 2  . diclofenac (VOLTAREN) 75 MG EC tablet Take 75 mg by mouth 2 (two) times daily.     Marland Kitchen FLUoxetine (PROZAC) 20 MG capsule Take 1 capsule (20 mg total) by mouth daily. 30 capsule 2  . methylphenidate (RITALIN) 10 MG tablet Take 1 tablet (10 mg total) by mouth 2 (two) times daily with breakfast and lunch. 60 tablet 0  . tiZANidine (ZANAFLEX) 4 MG tablet Take 4 mg by mouth 2 (two) times daily.       No current facility-administered medications for this visit.    Allergies:  Levaquin [levofloxacin in d5w]   Social History: The patient  reports that she has never smoked. She has never used smokeless tobacco. She reports that she does not drink alcohol or use drugs.   Family History: The patient's family history includes ADD / ADHD in her other; Alcohol abuse in her brother and brother; Bipolar disorder in her daughter; Drug abuse in her brother, brother, and other; Heart disease in her father; Hypertension in her father; Schizophrenia in her brother; Valvular heart disease in her mother.   ROS:  Please see the history of present illness. Otherwise, complete review of systems is positive for anxiety, fatigue, recent accidental fall with resulting small bone fracture in her left foot. All other systems are reviewed and negative.   Physical Exam: VS:  BP 132/78   Pulse 87   Ht 5\' 5"  (1.651 m)   Wt 165 lb 9.6 oz (75.1 kg)   SpO2 97%   BMI 27.56 kg/m , BMI Body mass index is 27.56 kg/m.  Wt Readings from Last 3 Encounters:  06/13/17 165 lb 9.6 oz (75.1 kg)  05/26/17 164 lb 6.4 oz (74.6 kg)  09/22/15 164 lb 9.6 oz (74.7 kg)    General: No distress. HEENT: Conjunctiva and lids normal, oropharynx clear. Neck: Supple, no elevated JVP or carotid bruits, no thyromegaly. Lungs: Clear  to auscultation, nonlabored breathing at rest. Thorax: Old T-shaped incision, well-healed. Cardiac: Regular rate and rhythm, no S3, soft systolic murmur, no pericardial rub. Abdomen: Soft, nontender, bowel sounds present, no guarding or rebound. Extremities: No pitting edema, distal pulses 2+. Skin: Warm and dry. Musculoskeletal: No kyphosis. Neuropsychiatric: Alert and oriented x3, affect grossly appropriate.  ECG: I personally reviewed the tracing from 05/26/2017 which shows sinus rhythm with R' in lead V1 and V2, left atrial enlargement.  Other Studies Reviewed Today:  Exercise Myoview  06/09/2017:  Blood pressure demonstrated a normal response to exercise.  Horizontal ST segment depression ST segment depression of 1 mm was noted during stress in the I and aVL leads.  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  The left ventricular ejection fraction is normal (55-65%).  Duke treadmill score is 2, suggesting intermediate risk for major cardiac events.  Overall low to intermediate risk study based on EKG, Duke treadmill score, and perfusion imaging.  Assessment and Plan:  1. Recurring atypical chest pain and fatigue. Recent exercise Myoview showed ST segment abnormalities and also apparent inferolateral ischemic defect with preserved LVEF. Duke treadmill score was intermediate risk. Plan is to arrange a diagnostic cardiac catheterization to best clarify coronary anatomy. She is in agreement to proceed.  2. History of probable ASD repair at [facility] many years ago. Echocardiogram from last year showed a trivial degree of left-to-right shunting in the region of the foramen ovale. RV contraction was normal as well as estimated PASP.  3. History of palpitations. She underwent previous cardiac monitoring that did not demonstrate any specific arrhythmias. Recent ECG reviewed above.  4. History of diet managed hyperlipidemia. She follows with Dr. Woody Seller.  Current medicines were reviewed with the patient today.   Disposition: Follow-up after cardiac catheterization.  Signed, Satira Sark, MD, [facility] 06/13/2017 2:11 PM    [facility] at [place], [place], [place] [address] Phone: [phone]; Fax: [phone]

## 2017-06-13 NOTE — Telephone Encounter (Signed)
Pre-cert Verification for the following procedure   [facility] 10/3 @ 1pm Dr Angelena Form

## 2017-06-13 NOTE — Patient Instructions (Signed)
Your physician recommends that you schedule a follow-up appointment in: [place]  Your physician recommends that you continue on your current medications as directed. Please refer to the Current Medication list given to you today.  Your physician has requested that you have a cardiac catheterization. Cardiac catheterization is used to diagnose and/or treat various heart conditions. Doctors may recommend this procedure for a number of different reasons. The most common reason is to evaluate chest pain. Chest pain can be a symptom of coronary artery disease (CAD), and cardiac catheterization can show whether plaque is narrowing or blocking your heart's arteries. This procedure is also used to evaluate the valves, as well as measure the blood flow and oxygen levels in different parts of your heart. For further information please visit [email or web address]. Please follow instruction sheet, as given.  Thank you for choosing [facility]!!

## 2017-06-17 ENCOUNTER — Telehealth: Payer: Self-pay

## 2017-06-17 NOTE — Telephone Encounter (Signed)
Patient contacted pre-catheterization at [facility] scheduled for:  06/18/2017 @ 1300 Verified arrival time and place:  [facility] @ 1100 Confirmed AM meds to be taken pre-cath with sip of water: Take ASA Confirmed patient has responsible person to drive home post procedure and observe patient for 24 hours:  yes Addl concerns:  None noted

## 2017-06-18 ENCOUNTER — Ambulatory Visit (HOSPITAL_COMMUNITY)
Admission: RE | Disposition: A | Discharge status: Home / Self Care | Payer: Self-pay | Source: Ambulatory Visit | Attending: Cardiovascular Disease

## 2017-06-18 ENCOUNTER — Ambulatory Visit (HOSPITAL_COMMUNITY)
Admission: RE | Admit: 2017-06-18 | Discharge: 2017-06-18 | Disposition: A | Discharge status: Home / Self Care | Payer: Medicare HMO | Source: Ambulatory Visit | Attending: Cardiovascular Disease | Admitting: Cardiovascular Disease

## 2017-06-18 DIAGNOSIS — E785 Hyperlipidemia, unspecified: Secondary | ICD-10-CM | POA: Diagnosis not present

## 2017-06-18 DIAGNOSIS — R69 Illness, unspecified: Secondary | ICD-10-CM | POA: Diagnosis not present

## 2017-06-18 DIAGNOSIS — F329 Major depressive disorder, single episode, unspecified: Secondary | ICD-10-CM | POA: Insufficient documentation

## 2017-06-18 DIAGNOSIS — R9439 Abnormal result of other cardiovascular function study: Secondary | ICD-10-CM | POA: Diagnosis not present

## 2017-06-18 DIAGNOSIS — M797 Fibromyalgia: Secondary | ICD-10-CM | POA: Diagnosis not present

## 2017-06-18 DIAGNOSIS — F988 Other specified behavioral and emotional disorders with onset usually occurring in childhood and adolescence: Secondary | ICD-10-CM | POA: Insufficient documentation

## 2017-06-18 DIAGNOSIS — E559 Vitamin D deficiency, unspecified: Secondary | ICD-10-CM | POA: Diagnosis not present

## 2017-06-18 DIAGNOSIS — F419 Anxiety disorder, unspecified: Secondary | ICD-10-CM | POA: Insufficient documentation

## 2017-06-18 DIAGNOSIS — R0789 Other chest pain: Secondary | ICD-10-CM | POA: Insufficient documentation

## 2017-06-18 HISTORY — PX: LEFT HEART CATH AND CORONARY ANGIOGRAPHY: CATH118249

## 2017-06-18 LAB — BASIC METABOLIC PANEL
Anion gap: 6 (ref 5–15)
BUN: 15 mg/dL (ref 6–20)
CHLORIDE: 106 mmol/L (ref 101–111)
CO2: 26 mmol/L (ref 22–32)
CREATININE: 0.82 mg/dL (ref 0.44–1.00)
Calcium: 9.5 mg/dL (ref 8.9–10.3)
GFR calc non Af Amer: >60 mL/min (ref >60)
Glucose, Bld: 131 mg/dL — ABNORMAL HIGH (ref 65–99)
Potassium: 4.5 mmol/L (ref 3.5–5.1)
SODIUM: 138 mmol/L (ref 135–145)

## 2017-06-18 LAB — CBC
HCT: 40.1 % (ref 36.0–46.0)
Hemoglobin: 13.8 g/dL (ref 12.0–15.0)
MCH: 28.7 pg (ref 26.0–34.0)
MCHC: 34.4 g/dL (ref 30.0–36.0)
MCV: 83.4 fL (ref 78.0–100.0)
PLATELETS: 239 10*3/uL (ref 150–400)
RBC: 4.81 MIL/uL (ref 3.87–5.11)
RDW: 12.7 % (ref 11.5–15.5)
WBC: 5.8 10*3/uL (ref 4.0–10.5)

## 2017-06-18 LAB — PROTIME-INR
INR: 1.03
PROTHROMBIN TIME: 13.4 s (ref 11.4–15.2)

## 2017-06-18 SURGERY — LEFT HEART CATH AND CORONARY ANGIOGRAPHY
Anesthesia: LOCAL

## 2017-06-18 MED ORDER — HEPARIN SODIUM (PORCINE) 1000 UNIT/ML IJ SOLN
INTRAMUSCULAR | Status: AC
  Filled 2017-06-18: qty 1

## 2017-06-18 MED ORDER — SODIUM CHLORIDE 0.9% FLUSH: 3.0000 mL | INTRAVENOUS | Status: DC | PRN

## 2017-06-18 MED ORDER — SODIUM CHLORIDE 0.9 % IV SOLN: 250.0000 mL | INTRAVENOUS | Status: DC | PRN

## 2017-06-18 MED ORDER — SODIUM CHLORIDE 0.9 % IV SOLN: INTRAVENOUS | Status: AC

## 2017-06-18 MED ORDER — VERAPAMIL HCL 2.5 MG/ML IV SOLN
INTRAVENOUS | Status: AC
  Filled 2017-06-18: qty 2

## 2017-06-18 MED ORDER — LIDOCAINE HCL (PF) 1 % IJ SOLN
INTRAMUSCULAR | Status: DC | PRN
  Administered 2017-06-18: 10 mL via SUBCUTANEOUS
  Administered 2017-06-18: 2 mL via SUBCUTANEOUS

## 2017-06-18 MED ORDER — SODIUM CHLORIDE 0.9 % WEIGHT BASED INFUSION: 1.0000 mL/kg/h | INTRAVENOUS | Status: DC

## 2017-06-18 MED ORDER — MIDAZOLAM HCL 2 MG/2ML IJ SOLN
INTRAMUSCULAR | Status: AC
  Filled 2017-06-18: qty 2

## 2017-06-18 MED ORDER — IOPAMIDOL (ISOVUE-370) INJECTION 76%
INTRAVENOUS | Status: DC | PRN
  Administered 2017-06-18: 50 mL via INTRA_ARTERIAL

## 2017-06-18 MED ORDER — SODIUM CHLORIDE 0.9% FLUSH: 3.0000 mL | Freq: Two times a day (BID) | INTRAVENOUS | Status: DC

## 2017-06-18 MED ORDER — LIDOCAINE HCL 2 % IJ SOLN
INTRAMUSCULAR | Status: AC
  Filled 2017-06-18: qty 10

## 2017-06-18 MED ORDER — FENTANYL CITRATE (PF) 100 MCG/2ML IJ SOLN
INTRAMUSCULAR | Status: DC | PRN
  Administered 2017-06-18 (×2): 25 ug via INTRAVENOUS

## 2017-06-18 MED ORDER — HEPARIN (PORCINE) IN NACL 2-0.9 UNIT/ML-% IJ SOLN
INTRAMUSCULAR | Status: AC
  Filled 2017-06-18: qty 1000

## 2017-06-18 MED ORDER — FENTANYL CITRATE (PF) 100 MCG/2ML IJ SOLN
INTRAMUSCULAR | Status: AC
  Filled 2017-06-18: qty 2

## 2017-06-18 MED ORDER — HEPARIN (PORCINE) IN NACL 2-0.9 UNIT/ML-% IJ SOLN
INTRAMUSCULAR | Status: AC | PRN
  Administered 2017-06-18: 1000 mL

## 2017-06-18 MED ORDER — SODIUM CHLORIDE 0.9 % WEIGHT BASED INFUSION
3.0000 mL/kg/h | INTRAVENOUS | Status: AC
  Administered 2017-06-18: 3 mL/kg/h via INTRAVENOUS

## 2017-06-18 MED ORDER — IOPAMIDOL (ISOVUE-370) INJECTION 76%
INTRAVENOUS | Status: AC
  Filled 2017-06-18: qty 100

## 2017-06-18 MED ORDER — VERAPAMIL HCL 2.5 MG/ML IV SOLN: INTRAVENOUS | Status: DC | PRN

## 2017-06-18 MED ORDER — MIDAZOLAM HCL 2 MG/2ML IJ SOLN
INTRAMUSCULAR | Status: DC | PRN
Start: 2017-06-18 — End: 2017-06-18
  Administered 2017-06-18: 2 mg via INTRAVENOUS
  Administered 2017-06-18: 1 mg via INTRAVENOUS

## 2017-06-18 MED ORDER — ASPIRIN 81 MG PO CHEW: 81.0000 mg | CHEWABLE_TABLET | ORAL | Status: DC

## 2017-06-18 SURGICAL SUPPLY — 11 items
CATH INFINITI 5FR MULTPACK ANG (CATHETERS) ×2 IMPLANT
GLIDESHEATH SLEND SS 6F .021 (SHEATH) IMPLANT
GUIDEWIRE INQWIRE 1.5J.035X260 (WIRE) IMPLANT
INQWIRE 1.5J .035X260CM (WIRE)
KIT HEART LEFT (KITS) ×2 IMPLANT
PACK CARDIAC CATHETERIZATION (CUSTOM PROCEDURE TRAY) ×2 IMPLANT
SHEATH PINNACLE 5F 10CM (SHEATH) ×2 IMPLANT
SYR MEDRAD MARK V 150ML (SYRINGE) ×2 IMPLANT
TRANSDUCER W/STOPCOCK (MISCELLANEOUS) ×2 IMPLANT
TUBING CIL FLEX 10 FLL-RA (TUBING) ×2 IMPLANT
WIRE EMERALD 3MM-J .035X150CM (WIRE) ×2 IMPLANT

## 2017-06-18 NOTE — H&P (View-Only) (Signed)
Cardiology Office Note  Date: 06/13/2017   ID: Beverly Contreras, DOB 1955-12-02, MRN [id number]  PCP: Glenda Chroman, MD  Primary Cardiologist: Rozann Lesches, MD   Chief Complaint  Patient presents with  . Follow-up testing    History of Present Illness: Beverly Contreras is a 61 y.o. female seen recently on September 10 and referred for follow-up stress testing. She has had fairly atypical chest pain that continues to recur. No definite trigger that she can identify. Also a vague sense of palpitations but no dizziness or syncope. She has a history of heart disease in the family.  Myoview study demonstrated abnormal ST segment changes and a region of inferolateral ischemia with overall normal LVEF. We discussed the results today.  In light of recurring, although atypical chest pain, we discussed arranging a diagnostic cardiac catheterization to best clarify her coronary anatomy. We reviewed the risks and benefits, and she is in agreement to proceed.  I reviewed her medications which are outlined below.  Past Medical History:  Diagnosis Date  . Anxiety   . Attention deficit disorder   . Depression   . Fibromyalgia   . Hyperlipidemia   . Vitamin D deficiency     Past Surgical History:  Procedure Laterality Date  . [place], reportedly to repair "hole in heart"  . TUBAL LIGATION      Current Outpatient Prescriptions  Medication Sig Dispense Refill  . clonazePAM (KLONOPIN) 0.5 MG tablet Take 1 tablet (0.5 mg total) by mouth as needed for anxiety. 30 tablet 2  . diclofenac (VOLTAREN) 75 MG EC tablet Take 75 mg by mouth 2 (two) times daily.     Marland Kitchen FLUoxetine (PROZAC) 20 MG capsule Take 1 capsule (20 mg total) by mouth daily. 30 capsule 2  . methylphenidate (RITALIN) 10 MG tablet Take 1 tablet (10 mg total) by mouth 2 (two) times daily with breakfast and lunch. 60 tablet 0  . tiZANidine (ZANAFLEX) 4 MG tablet Take 4 mg by mouth 2 (two) times daily.       No current facility-administered medications for this visit.    Allergies:  Levaquin [levofloxacin in d5w]   Social History: The patient  reports that she has never smoked. She has never used smokeless tobacco. She reports that she does not drink alcohol or use drugs.   Family History: The patient's family history includes ADD / ADHD in her other; Alcohol abuse in her brother and brother; Bipolar disorder in her daughter; Drug abuse in her brother, brother, and other; Heart disease in her father; Hypertension in her father; Schizophrenia in her brother; Valvular heart disease in her mother.   ROS:  Please see the history of present illness. Otherwise, complete review of systems is positive for anxiety, fatigue, recent accidental fall with resulting small bone fracture in her left foot. All other systems are reviewed and negative.   Physical Exam: VS:  BP 132/78   Pulse 87   Ht 5\' 5"  (1.651 m)   Wt 165 lb 9.6 oz (75.1 kg)   SpO2 97%   BMI 27.56 kg/m , BMI Body mass index is 27.56 kg/m.  Wt Readings from Last 3 Encounters:  06/13/17 165 lb 9.6 oz (75.1 kg)  05/26/17 164 lb 6.4 oz (74.6 kg)  09/22/15 164 lb 9.6 oz (74.7 kg)    General: No distress. HEENT: Conjunctiva and lids normal, oropharynx clear. Neck: Supple, no elevated JVP or carotid bruits, no thyromegaly. Lungs: Clear  to auscultation, nonlabored breathing at rest. Thorax: Old T-shaped incision, well-healed. Cardiac: Regular rate and rhythm, no S3, soft systolic murmur, no pericardial rub. Abdomen: Soft, nontender, bowel sounds present, no guarding or rebound. Extremities: No pitting edema, distal pulses 2+. Skin: Warm and dry. Musculoskeletal: No kyphosis. Neuropsychiatric: Alert and oriented x3, affect grossly appropriate.  ECG: I personally reviewed the tracing from 05/26/2017 which shows sinus rhythm with R' in lead V1 and V2, left atrial enlargement.  Other Studies Reviewed Today:  Exercise Myoview  06/09/2017:  Blood pressure demonstrated a normal response to exercise.  Horizontal ST segment depression ST segment depression of 1 mm was noted during stress in the I and aVL leads.  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  The left ventricular ejection fraction is normal (55-65%).  Duke treadmill score is 2, suggesting intermediate risk for major cardiac events.  Overall low to intermediate risk study based on EKG, Duke treadmill score, and perfusion imaging.  Assessment and Plan:  1. Recurring atypical chest pain and fatigue. Recent exercise Myoview showed ST segment abnormalities and also apparent inferolateral ischemic defect with preserved LVEF. Duke treadmill score was intermediate risk. Plan is to arrange a diagnostic cardiac catheterization to best clarify coronary anatomy. She is in agreement to proceed.  2. History of probable ASD repair at [facility] many years ago. Echocardiogram from last year showed a trivial degree of left-to-right shunting in the region of the foramen ovale. RV contraction was normal as well as estimated PASP.  3. History of palpitations. She underwent previous cardiac monitoring that did not demonstrate any specific arrhythmias. Recent ECG reviewed above.  4. History of diet managed hyperlipidemia. She follows with Dr. Woody Seller.  Current medicines were reviewed with the patient today.   Disposition: Follow-up after cardiac catheterization.  Signed, Satira Sark, MD, [facility] 06/13/2017 2:11 PM    [facility] at [place], [place], [place] [address] Phone: [phone]; Fax: [phone]

## 2017-06-18 NOTE — Discharge Instructions (Signed)
Angiogram, Care After °This sheet gives you information about how to care for yourself after your procedure. Your health care provider may also give you more specific instructions. If you have problems or questions, contact your health care provider. °What can I expect after the procedure? °After the procedure, it is common to have bruising and tenderness at the catheter insertion area. °Follow these instructions at home: °Insertion site care  °· Follow instructions from your health care provider about how to take care of your insertion site. Make sure you: °¨ Wash your hands with soap and water before you change your bandage (dressing). If soap and water are not available, use hand sanitizer. °¨ Change your dressing as told by your health care provider. °¨ Leave stitches (sutures), skin glue, or adhesive strips in place. These skin closures may need to stay in place for 2 weeks or longer. If adhesive strip edges start to loosen and curl up, you may trim the loose edges. Do not remove adhesive strips completely unless your health care provider tells you to do that. °· Do not take baths, swim, or use a hot tub until your health care provider approves. °· You may shower 24-48 hours after the procedure or as told by your health care provider. °¨ Gently wash the site with plain soap and water. °¨ Pat the area dry with a clean towel. °¨ Do not rub the site. This may cause bleeding. °· Do not apply powder or lotion to the site. Keep the site clean and dry. °· Check your insertion site every day for signs of infection. Check for: °¨ Redness, swelling, or pain. °¨ Fluid or blood. °¨ Warmth. °¨ Pus or a bad smell. °Activity  °· Rest as told by your health care provider, usually for 1-2 days. °· Do not lift anything that is heavier than 10 lbs. (4.5 kg) or as told by your health care provider. °· Do not drive for 24 hours if you were given a medicine to help you relax (sedative). °· Do not drive or use heavy machinery while  taking prescription pain medicine. °General instructions  °· Return to your normal activities as told by your health care provider, usually in about a week. Ask your health care provider what activities are safe for you. °· If the catheter site starts bleeding, lie flat and put pressure on the site. If the bleeding does not stop, get help right away. This is a medical emergency. °· Drink enough fluid to keep your urine clear or pale yellow. This helps flush the contrast dye from your body. °· Take over-the-counter and prescription medicines only as told by your health care provider. °· Keep all follow-up visits as told by your health care provider. This is important. °Contact a health care provider if: °· You have a fever or chills. °· You have redness, swelling, or pain around your insertion site. °· You have fluid or blood coming from your insertion site. °· The insertion site feels warm to the touch. °· You have pus or a bad smell coming from your insertion site. °· You have bruising around the insertion site. °· You notice blood collecting in the tissue around the catheter site (hematoma). The hematoma may be painful to the touch. °Get help right away if: °· You have severe pain at the catheter insertion area. °· The catheter insertion area swells very fast. °· The catheter insertion area is bleeding, and the bleeding does not stop when you hold steady pressure on   the area. °· The area near or just beyond the catheter insertion site becomes pale, cool, tingly, or numb. °These symptoms may represent a serious problem that is an emergency. Do not wait to see if the symptoms will go away. Get medical help right away. Call your local emergency services (911 in the [place] Do not drive yourself to the hospital. °Summary °· After the procedure, it is common to have bruising and tenderness at the catheter insertion area. °· After the procedure, it is important to rest and drink plenty of fluids. °· Do not take baths,  swim, or use a hot tub until your health care provider says it is okay to do so. You may shower 24-48 hours after the procedure or as told by your health care provider. °· If the catheter site starts bleeding, lie flat and put pressure on the site. If the bleeding does not stop, get help right away. This is a medical emergency. °This information is not intended to replace advice given to you by your health care provider. Make sure you discuss any questions you have with your health care provider. °Document Released: 03/21/2005 Document Revised: 08/07/2016 Document Reviewed: 08/07/2016 °Elsevier Interactive Patient Education © 2017 [employer] ° °

## 2017-06-18 NOTE — Progress Notes (Signed)
Removal of Right femoral arterial sheath without complication. Manual compression applied for 20 minutes. Distal pulses palpable. SP02: 100%, HR: 80, BP: 151/68. Sterile 4x4 gauze applied to insertion site with tegaderm to secure. Dressing dry and intact. Patient given post care instructions and was able to repeat recovery process.

## 2017-06-18 NOTE — Interval H&P Note (Signed)
History and Physical Interval Note:  06/18/2017 12:54 PM  Beverly Contreras  has presented today for cardiac cath with the diagnosis of chest pain, abnormal stress test  The various methods of treatment have been discussed with the patient and family. After consideration of risks, benefits and other options for treatment, the patient has consented to  Procedure(s): LEFT HEART CATH AND CORONARY ANGIOGRAPHY (N/A) as a surgical intervention .  The patient's history has been reviewed, patient examined, no change in status, stable for surgery.  I have reviewed the patient's chart and labs.  Questions were answered to the patient's satisfaction.    Cath Lab Visit (complete for each Cath Lab visit)  Clinical Evaluation Leading to the Procedure:   ACS: No.  Non-ACS:    Anginal Classification: CCS II  Anti-ischemic medical therapy: No Therapy  Non-Invasive Test Results: Intermediate-risk stress test findings: cardiac mortality 1-3%/year  Prior CABG: No previous CABG         [name]

## 2017-06-19 ENCOUNTER — Encounter (HOSPITAL_COMMUNITY): Payer: Self-pay | Admitting: Cardiovascular Disease

## 2017-06-20 MED FILL — Lidocaine HCl Local Inj 2%: INTRAMUSCULAR | Qty: 10 | Status: AC

## 2017-06-20 MED FILL — Verapamil HCl IV Soln 2.5 MG/ML: INTRAVENOUS | Qty: 2 | Status: AC

## 2017-06-25 DIAGNOSIS — Z23 Encounter for immunization: Secondary | ICD-10-CM | POA: Diagnosis not present

## 2017-07-07 ENCOUNTER — Encounter (INDEPENDENT_AMBULATORY_CARE_PROVIDER_SITE_OTHER): Payer: Self-pay

## 2017-07-07 ENCOUNTER — Ambulatory Visit (INDEPENDENT_AMBULATORY_CARE_PROVIDER_SITE_OTHER): Payer: Medicare HMO | Admitting: Cardiology

## 2017-07-07 ENCOUNTER — Encounter: Payer: Self-pay | Admitting: Cardiology

## 2017-07-07 VITALS — BP 138/80 | HR 80 | Ht 65.0 in | Wt 169.8 lb

## 2017-07-07 DIAGNOSIS — R0789 Other chest pain: Secondary | ICD-10-CM | POA: Diagnosis not present

## 2017-07-07 DIAGNOSIS — Z8774 Personal history of (corrected) congenital malformations of heart and circulatory system: Secondary | ICD-10-CM | POA: Diagnosis not present

## 2017-07-07 NOTE — Progress Notes (Signed)
Cardiology Office Note  Date: 07/07/2017   ID: Beverly Contreras, DOB [birth year], MRN [id number]   PCP: Glenda Chroman, MD  Primary Cardiologist: Rozann Lesches, MD   Chief Complaint  Patient presents with  . Follow-up cardiac catheterization    History of Present Illness: Beverly Contreras is a 61 y.o. female last seen in September. She was referred at that time for a cardiac catheterization in light of recurring atypical chest pain and an abnormal Myoview. Fortunately, the procedure revealed normal coronary arteries. She presents today and we went over the results. She was reassured.  I reviewed her medications which are outlined below. She states she continues to follow with Dr. Woody Seller and we'll plan to do so regularly.  In light of her recent cardiac testing including cardiac catheterization and echocardiogram, we will go to a one-year follow-up for now.  Past Medical History:  Diagnosis Date  . Anxiety   . Attention deficit disorder   . Depression   . Fibromyalgia   . Hyperlipidemia   . Vitamin D deficiency     Past Surgical History:  Procedure Laterality Date  . [place], reportedly to repair "hole in heart"  . LEFT HEART CATH AND CORONARY ANGIOGRAPHY N/A 06/18/2017   Procedure: LEFT HEART CATH AND CORONARY ANGIOGRAPHY;  Surgeon: Burnell Blanks, MD;  Location: [facility] [facility];  Service: Cardiovascular;  Laterality: N/A;  . TUBAL LIGATION      Current Outpatient Prescriptions  Medication Sig Dispense Refill  . clonazePAM (KLONOPIN) 0.5 MG tablet Take 1 tablet (0.5 mg total) by mouth as needed for anxiety. (Patient taking differently: Take 0.5 mg by mouth 2 (two) times daily as needed for anxiety. ) 30 tablet 2  . diclofenac (VOLTAREN) 75 MG EC tablet Take 75 mg by mouth 2 (two) times daily.     Marland Kitchen FLUoxetine (PROZAC) 20 MG capsule Take 1 capsule (20 mg total) by mouth daily. 30 capsule 2  . methylphenidate (RITALIN) 10 MG tablet  Take 1 tablet (10 mg total) by mouth 2 (two) times daily with breakfast and lunch. 60 tablet 0  . tiZANidine (ZANAFLEX) 4 MG tablet Take 4 mg by mouth at bedtime.      No current facility-administered medications for this visit.    Allergies:  Levaquin [levofloxacin in d5w]   Social History: The patient  reports that she has never smoked. She has never used smokeless tobacco. She reports that she does not drink alcohol or use drugs.   ROS:  Please see the history of present illness. Otherwise, complete review of systems is positive for indigestion.  All other systems are reviewed and negative.   Physical Exam: VS:  BP 138/80   Pulse 80   Ht 5\' 5"  (1.651 m)   Wt 169 lb 12.8 oz (77 kg)   BMI 28.26 kg/m , BMI Body mass index is 28.26 kg/m.  Wt Readings from Last 3 Encounters:  07/07/17 169 lb 12.8 oz (77 kg)  06/18/17 165 lb (74.8 kg)  06/13/17 165 lb 9.6 oz (75.1 kg)    General: Patient appears comfortable at rest. HEENT: Conjunctiva and lids normal, oropharynx clear. Neck: Supple, no elevated JVP or carotid bruits, no thyromegaly. Lungs: Clear to auscultation, nonlabored breathing at rest. Cardiac: Regular rate and rhythm, no S3, soft systolic murmur, no pericardial rub. Abdomen: Soft, nontender, bowel sounds present, no guarding or rebound. Extremities: No pitting edema, distal pulses 2+.  ECG: I personally reviewed  the tracing from9/06/2017 which showed sinus rhythm with R' in lead V1 and V2, left atrial enlargement.  Recent Labwork: 06/18/2017: BUN 15; Creatinine, Ser 0.82; Hemoglobin 13.8; Platelets 239; Potassium 4.5; Sodium 138   Other Studies Reviewed Today:  Cardiac catheterization 06/18/2017:  The left ventricular systolic function is normal.  LV end diastolic pressure is normal.  The left ventricular ejection fraction is greater than 65% by visual estimate.  There is no mitral valve regurgitation.   1. No angiographic evidence of CAD 2. Normal LV systolic  function  Echocardiogram 09/27/2015: Study Conclusions  - Left ventricle: The cavity size was normal. Wall thickness was   increased in a pattern of mild LVH. Systolic function was normal.   The estimated ejection fraction was in the range of 55% to 60%.   Wall motion was normal; there were no regional wall motion   abnormalities. Doppler parameters are consistent with abnormal   left ventricular relaxation (grade 1 diastolic dysfunction). - Aortic valve: Not well visualized. Probably trileaflet; mildly   calcified leaflets. There looks to be bowing and restricted   motion of the left coronary cusp, but no obvious stenosis. There   was mild regurgitation. - Mitral valve: Calcified annulus. There was trivial regurgitation. - Left atrium: The atrium was at the upper limits of normal in   size. - Right atrium: The atrium was at the upper limits of normal in   size. Central venous pressure (est): 3 mm Hg. - Atrial septum: Increased echogenicity suggesting mild thickening.   Cannot completely exclude a trivial degree of left-to-right shunt   through PFO, seen only intermittently by color Doppler. There is   no large ASD evident however. - Tricuspid valve: There was trivial regurgitation. - Pulmonary arteries: PA peak pressure: 28 mm Hg (S). - Pericardium, extracardiac: A prominent pericardial fat pad was   present.  Impressions:  - Mildly increased LV wall thickness with LVEF 55-60%. Grade 1   diastolic dysfunction with normal filling pressures. Upper normal   left and right atrial chamber size. Mitral annular calcification   with trivial mitral regurgitation. Aortic valve is not well seen   but appears to be trileaflet and mildly calcified with bowing and   some restrictive motion of the left coronary cusp. No aortic   stenosis. Mild aortic regurgitation. There is thickening noted of   the interatrial septum with perhaps a trivial degree of   left-to-right shunting by PFO, but no  obvious ASD. PASP is normal   at 28 mmHg.  Assessment and Plan:  1. History of recurring atypical chest pain. Recent cardiac catheterization revealed normal coronary arteries. No further cardiac testing is planned at this time. I recommended that she keep follow-up with her PCP Dr. Woody Seller for further assessment.  2. History of suspected ASD repair at [facility]. Follow-up echocardiogram is outlined above with trivial degree of left-to-right shunting in the region of the foramen ovale. RV contraction normal as well as PASP.  Current medicines were reviewed with the patient today.  Disposition: Follow-up in one year.  Signed, Satira Sark, MD, [facility] 07/07/2017 9:01 AM    [facility] [place], [place], [place] [address] Phone: [phone]; Fax: [phone]

## 2017-07-07 NOTE — Patient Instructions (Signed)

## 2017-08-19 DIAGNOSIS — J329 Chronic sinusitis, unspecified: Secondary | ICD-10-CM | POA: Diagnosis not present

## 2017-08-19 DIAGNOSIS — E7800 Pure hypercholesterolemia, unspecified: Secondary | ICD-10-CM | POA: Diagnosis not present

## 2017-08-19 DIAGNOSIS — M797 Fibromyalgia: Secondary | ICD-10-CM | POA: Diagnosis not present

## 2017-08-19 DIAGNOSIS — I10 Essential (primary) hypertension: Secondary | ICD-10-CM | POA: Diagnosis not present

## 2017-08-19 DIAGNOSIS — Z6827 Body mass index (BMI) 27.0-27.9, adult: Secondary | ICD-10-CM | POA: Diagnosis not present

## 2017-08-19 DIAGNOSIS — Z299 Encounter for prophylactic measures, unspecified: Secondary | ICD-10-CM | POA: Diagnosis not present

## 2017-08-19 DIAGNOSIS — R69 Illness, unspecified: Secondary | ICD-10-CM | POA: Diagnosis not present

## 2017-09-25 DIAGNOSIS — Z299 Encounter for prophylactic measures, unspecified: Secondary | ICD-10-CM | POA: Diagnosis not present

## 2017-09-25 DIAGNOSIS — M797 Fibromyalgia: Secondary | ICD-10-CM | POA: Diagnosis not present

## 2017-09-25 DIAGNOSIS — E7800 Pure hypercholesterolemia, unspecified: Secondary | ICD-10-CM | POA: Diagnosis not present

## 2017-09-25 DIAGNOSIS — Z789 Other specified health status: Secondary | ICD-10-CM | POA: Diagnosis not present

## 2017-09-25 DIAGNOSIS — I10 Essential (primary) hypertension: Secondary | ICD-10-CM | POA: Diagnosis not present

## 2017-09-25 DIAGNOSIS — J069 Acute upper respiratory infection, unspecified: Secondary | ICD-10-CM | POA: Diagnosis not present

## 2017-09-25 DIAGNOSIS — R69 Illness, unspecified: Secondary | ICD-10-CM | POA: Diagnosis not present

## 2017-09-25 DIAGNOSIS — Z6827 Body mass index (BMI) 27.0-27.9, adult: Secondary | ICD-10-CM | POA: Diagnosis not present

## 2017-10-27 DIAGNOSIS — Z299 Encounter for prophylactic measures, unspecified: Secondary | ICD-10-CM | POA: Diagnosis not present

## 2017-10-27 DIAGNOSIS — J069 Acute upper respiratory infection, unspecified: Secondary | ICD-10-CM | POA: Diagnosis not present

## 2017-10-27 DIAGNOSIS — Z713 Dietary counseling and surveillance: Secondary | ICD-10-CM | POA: Diagnosis not present

## 2017-10-27 DIAGNOSIS — Z6827 Body mass index (BMI) 27.0-27.9, adult: Secondary | ICD-10-CM | POA: Diagnosis not present

## 2017-10-27 DIAGNOSIS — I10 Essential (primary) hypertension: Secondary | ICD-10-CM | POA: Diagnosis not present

## 2018-01-10 DIAGNOSIS — N3001 Acute cystitis with hematuria: Secondary | ICD-10-CM | POA: Diagnosis not present

## 2018-01-10 DIAGNOSIS — R69 Illness, unspecified: Secondary | ICD-10-CM | POA: Diagnosis not present

## 2018-01-10 DIAGNOSIS — M797 Fibromyalgia: Secondary | ICD-10-CM | POA: Diagnosis not present

## 2018-01-10 DIAGNOSIS — G8929 Other chronic pain: Secondary | ICD-10-CM | POA: Diagnosis not present

## 2018-01-10 DIAGNOSIS — Z79899 Other long term (current) drug therapy: Secondary | ICD-10-CM | POA: Diagnosis not present

## 2018-01-10 DIAGNOSIS — M542 Cervicalgia: Secondary | ICD-10-CM | POA: Diagnosis not present

## 2018-02-02 DIAGNOSIS — Z1231 Encounter for screening mammogram for malignant neoplasm of breast: Secondary | ICD-10-CM | POA: Diagnosis not present

## 2018-05-05 IMAGING — NM NM MYOCAR MULTI W/SPECT W/WALL MOTION & EF
2 series · 12 of 12 positions shown · non-contrast
Comparison: none

[Series 1: rest · 6.51mm/px · 6 of 64 frames shown]
[frame 6/64]
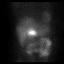
[frame 16/64]
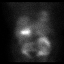
[frame 27/64]
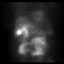
[frame 38/64]
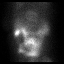
[frame 48/64]
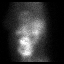
[frame 59/64]
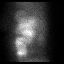

[Series 2: stress gated · 6.51mm/px · 6 of 64 frames shown]
[frame 6/64]
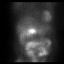
[frame 16/64]
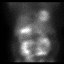
[frame 27/64]
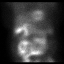
[frame 38/64]
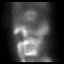
[frame 48/64]
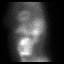
[frame 59/64]
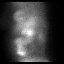

[12 of 12 positions shown; findings below may reference images not displayed]

Canned report from images found in remote index.

Refer to host system for actual result text.

## 2018-05-12 DIAGNOSIS — Z789 Other specified health status: Secondary | ICD-10-CM | POA: Diagnosis not present

## 2018-05-12 DIAGNOSIS — I10 Essential (primary) hypertension: Secondary | ICD-10-CM | POA: Diagnosis not present

## 2018-05-12 DIAGNOSIS — J069 Acute upper respiratory infection, unspecified: Secondary | ICD-10-CM | POA: Diagnosis not present

## 2018-05-12 DIAGNOSIS — Z299 Encounter for prophylactic measures, unspecified: Secondary | ICD-10-CM | POA: Diagnosis not present

## 2018-05-12 DIAGNOSIS — Z6827 Body mass index (BMI) 27.0-27.9, adult: Secondary | ICD-10-CM | POA: Diagnosis not present

## 2018-05-12 DIAGNOSIS — M797 Fibromyalgia: Secondary | ICD-10-CM | POA: Diagnosis not present

## 2018-06-08 DIAGNOSIS — R69 Illness, unspecified: Secondary | ICD-10-CM | POA: Diagnosis not present

## 2018-06-25 DIAGNOSIS — Z1339 Encounter for screening examination for other mental health and behavioral disorders: Secondary | ICD-10-CM | POA: Diagnosis not present

## 2018-06-25 DIAGNOSIS — J309 Allergic rhinitis, unspecified: Secondary | ICD-10-CM | POA: Diagnosis not present

## 2018-06-25 DIAGNOSIS — Z1211 Encounter for screening for malignant neoplasm of colon: Secondary | ICD-10-CM | POA: Diagnosis not present

## 2018-06-25 DIAGNOSIS — Z6826 Body mass index (BMI) 26.0-26.9, adult: Secondary | ICD-10-CM | POA: Diagnosis not present

## 2018-06-25 DIAGNOSIS — M797 Fibromyalgia: Secondary | ICD-10-CM | POA: Diagnosis not present

## 2018-06-25 DIAGNOSIS — Z0000 Encounter for general adult medical examination without abnormal findings: Secondary | ICD-10-CM | POA: Diagnosis not present

## 2018-06-25 DIAGNOSIS — R5383 Other fatigue: Secondary | ICD-10-CM | POA: Diagnosis not present

## 2018-06-25 DIAGNOSIS — J019 Acute sinusitis, unspecified: Secondary | ICD-10-CM | POA: Diagnosis not present

## 2018-06-25 DIAGNOSIS — Z7189 Other specified counseling: Secondary | ICD-10-CM | POA: Diagnosis not present

## 2018-06-25 DIAGNOSIS — Z1331 Encounter for screening for depression: Secondary | ICD-10-CM | POA: Diagnosis not present

## 2018-06-30 DIAGNOSIS — R5383 Other fatigue: Secondary | ICD-10-CM | POA: Diagnosis not present

## 2018-06-30 DIAGNOSIS — Z79899 Other long term (current) drug therapy: Secondary | ICD-10-CM | POA: Diagnosis not present

## 2018-07-03 DIAGNOSIS — E7800 Pure hypercholesterolemia, unspecified: Secondary | ICD-10-CM | POA: Diagnosis not present

## 2018-07-03 DIAGNOSIS — E1165 Type 2 diabetes mellitus with hyperglycemia: Secondary | ICD-10-CM | POA: Diagnosis not present

## 2018-07-03 DIAGNOSIS — R7309 Other abnormal glucose: Secondary | ICD-10-CM | POA: Diagnosis not present

## 2018-07-03 DIAGNOSIS — Z6826 Body mass index (BMI) 26.0-26.9, adult: Secondary | ICD-10-CM | POA: Diagnosis not present

## 2018-07-03 DIAGNOSIS — I10 Essential (primary) hypertension: Secondary | ICD-10-CM | POA: Diagnosis not present

## 2018-07-03 DIAGNOSIS — Z299 Encounter for prophylactic measures, unspecified: Secondary | ICD-10-CM | POA: Diagnosis not present

## 2018-10-08 DIAGNOSIS — I10 Essential (primary) hypertension: Secondary | ICD-10-CM | POA: Diagnosis not present

## 2018-10-08 DIAGNOSIS — E1165 Type 2 diabetes mellitus with hyperglycemia: Secondary | ICD-10-CM | POA: Diagnosis not present

## 2018-10-08 DIAGNOSIS — Z299 Encounter for prophylactic measures, unspecified: Secondary | ICD-10-CM | POA: Diagnosis not present

## 2018-10-08 DIAGNOSIS — Z6826 Body mass index (BMI) 26.0-26.9, adult: Secondary | ICD-10-CM | POA: Diagnosis not present

## 2018-10-08 DIAGNOSIS — J069 Acute upper respiratory infection, unspecified: Secondary | ICD-10-CM | POA: Diagnosis not present

## 2018-11-05 ENCOUNTER — Telehealth: Payer: Self-pay | Admitting: Cardiology

## 2018-11-05 NOTE — Telephone Encounter (Signed)
FYI.  °Contacted patient regarding recall appointment, patient notified our office they did not wish to keep this appointment at this time.  Deleted recall from system. °

## 2018-12-04 DIAGNOSIS — M797 Fibromyalgia: Secondary | ICD-10-CM | POA: Diagnosis not present

## 2018-12-04 DIAGNOSIS — E1165 Type 2 diabetes mellitus with hyperglycemia: Secondary | ICD-10-CM | POA: Diagnosis not present

## 2018-12-04 DIAGNOSIS — Z299 Encounter for prophylactic measures, unspecified: Secondary | ICD-10-CM | POA: Diagnosis not present

## 2018-12-04 DIAGNOSIS — I10 Essential (primary) hypertension: Secondary | ICD-10-CM | POA: Diagnosis not present

## 2018-12-04 DIAGNOSIS — Z6827 Body mass index (BMI) 27.0-27.9, adult: Secondary | ICD-10-CM | POA: Diagnosis not present

## 2018-12-04 DIAGNOSIS — R350 Frequency of micturition: Secondary | ICD-10-CM | POA: Diagnosis not present

## 2019-02-16 DIAGNOSIS — K644 Residual hemorrhoidal skin tags: Secondary | ICD-10-CM | POA: Diagnosis not present

## 2019-02-16 DIAGNOSIS — I10 Essential (primary) hypertension: Secondary | ICD-10-CM | POA: Diagnosis not present

## 2019-02-16 DIAGNOSIS — E1165 Type 2 diabetes mellitus with hyperglycemia: Secondary | ICD-10-CM | POA: Diagnosis not present

## 2019-02-16 DIAGNOSIS — Z6827 Body mass index (BMI) 27.0-27.9, adult: Secondary | ICD-10-CM | POA: Diagnosis not present

## 2019-02-16 DIAGNOSIS — Z299 Encounter for prophylactic measures, unspecified: Secondary | ICD-10-CM | POA: Diagnosis not present

## 2019-02-16 DIAGNOSIS — M797 Fibromyalgia: Secondary | ICD-10-CM | POA: Diagnosis not present

## 2019-02-16 DIAGNOSIS — N390 Urinary tract infection, site not specified: Secondary | ICD-10-CM | POA: Diagnosis not present

## 2019-03-11 DIAGNOSIS — E1165 Type 2 diabetes mellitus with hyperglycemia: Secondary | ICD-10-CM | POA: Diagnosis not present

## 2019-03-11 DIAGNOSIS — Z299 Encounter for prophylactic measures, unspecified: Secondary | ICD-10-CM | POA: Diagnosis not present

## 2019-03-11 DIAGNOSIS — N3281 Overactive bladder: Secondary | ICD-10-CM | POA: Diagnosis not present

## 2019-03-11 DIAGNOSIS — J069 Acute upper respiratory infection, unspecified: Secondary | ICD-10-CM | POA: Diagnosis not present

## 2019-03-11 DIAGNOSIS — Z6827 Body mass index (BMI) 27.0-27.9, adult: Secondary | ICD-10-CM | POA: Diagnosis not present

## 2019-03-11 DIAGNOSIS — R300 Dysuria: Secondary | ICD-10-CM | POA: Diagnosis not present

## 2019-05-18 ENCOUNTER — Ambulatory Visit (INDEPENDENT_AMBULATORY_CARE_PROVIDER_SITE_OTHER): Payer: Medicare HMO | Admitting: Urology

## 2019-05-18 DIAGNOSIS — R109 Unspecified abdominal pain: Secondary | ICD-10-CM | POA: Diagnosis not present

## 2019-05-26 DIAGNOSIS — Z6828 Body mass index (BMI) 28.0-28.9, adult: Secondary | ICD-10-CM | POA: Diagnosis not present

## 2019-05-26 DIAGNOSIS — M79671 Pain in right foot: Secondary | ICD-10-CM | POA: Diagnosis not present

## 2019-05-26 DIAGNOSIS — E1165 Type 2 diabetes mellitus with hyperglycemia: Secondary | ICD-10-CM | POA: Diagnosis not present

## 2019-05-26 DIAGNOSIS — I10 Essential (primary) hypertension: Secondary | ICD-10-CM | POA: Diagnosis not present

## 2019-05-26 DIAGNOSIS — M797 Fibromyalgia: Secondary | ICD-10-CM | POA: Diagnosis not present

## 2019-05-26 DIAGNOSIS — Z299 Encounter for prophylactic measures, unspecified: Secondary | ICD-10-CM | POA: Diagnosis not present

## 2019-05-26 DIAGNOSIS — E7800 Pure hypercholesterolemia, unspecified: Secondary | ICD-10-CM | POA: Diagnosis not present

## 2019-05-27 DIAGNOSIS — M79671 Pain in right foot: Secondary | ICD-10-CM | POA: Diagnosis not present

## 2019-06-03 DIAGNOSIS — R69 Illness, unspecified: Secondary | ICD-10-CM | POA: Diagnosis not present

## 2019-06-08 DIAGNOSIS — M722 Plantar fascial fibromatosis: Secondary | ICD-10-CM | POA: Diagnosis not present

## 2019-06-08 DIAGNOSIS — M79671 Pain in right foot: Secondary | ICD-10-CM | POA: Diagnosis not present

## 2019-06-08 DIAGNOSIS — M7731 Calcaneal spur, right foot: Secondary | ICD-10-CM | POA: Diagnosis not present

## 2019-07-13 ENCOUNTER — Other Ambulatory Visit: Payer: Self-pay | Admitting: *Deleted

## 2019-07-13 DIAGNOSIS — Z20822 Contact with and (suspected) exposure to covid-19: Secondary | ICD-10-CM

## 2019-07-15 LAB — NOVEL CORONAVIRUS, NAA: SARS-CoV-2, NAA: NOT DETECTED

## 2019-08-02 DIAGNOSIS — Z789 Other specified health status: Secondary | ICD-10-CM | POA: Diagnosis not present

## 2019-08-02 DIAGNOSIS — Z299 Encounter for prophylactic measures, unspecified: Secondary | ICD-10-CM | POA: Diagnosis not present

## 2019-08-02 DIAGNOSIS — I10 Essential (primary) hypertension: Secondary | ICD-10-CM | POA: Diagnosis not present

## 2019-08-02 DIAGNOSIS — J019 Acute sinusitis, unspecified: Secondary | ICD-10-CM | POA: Diagnosis not present

## 2019-08-06 ENCOUNTER — Other Ambulatory Visit: Payer: Self-pay

## 2019-08-06 DIAGNOSIS — Z20822 Contact with and (suspected) exposure to covid-19: Secondary | ICD-10-CM

## 2019-08-08 LAB — NOVEL CORONAVIRUS, NAA: SARS-CoV-2, NAA: DETECTED — AB

## 2019-08-09 DIAGNOSIS — M546 Pain in thoracic spine: Secondary | ICD-10-CM | POA: Diagnosis not present

## 2019-08-09 DIAGNOSIS — U071 COVID-19: Secondary | ICD-10-CM | POA: Diagnosis not present

## 2019-08-13 DIAGNOSIS — I10 Essential (primary) hypertension: Secondary | ICD-10-CM | POA: Diagnosis not present

## 2019-08-13 DIAGNOSIS — Z299 Encounter for prophylactic measures, unspecified: Secondary | ICD-10-CM | POA: Diagnosis not present

## 2019-08-13 DIAGNOSIS — Z6828 Body mass index (BMI) 28.0-28.9, adult: Secondary | ICD-10-CM | POA: Diagnosis not present

## 2019-08-13 DIAGNOSIS — U071 COVID-19: Secondary | ICD-10-CM | POA: Diagnosis not present

## 2019-08-13 DIAGNOSIS — E7800 Pure hypercholesterolemia, unspecified: Secondary | ICD-10-CM | POA: Diagnosis not present

## 2019-08-13 DIAGNOSIS — E559 Vitamin D deficiency, unspecified: Secondary | ICD-10-CM | POA: Diagnosis not present

## 2019-08-17 ENCOUNTER — Other Ambulatory Visit: Payer: Self-pay

## 2019-08-17 ENCOUNTER — Ambulatory Visit: Payer: Medicare HMO | Admitting: Urology

## 2019-08-17 DIAGNOSIS — Z20822 Contact with and (suspected) exposure to covid-19: Secondary | ICD-10-CM

## 2019-08-19 LAB — NOVEL CORONAVIRUS, NAA

## 2019-08-24 ENCOUNTER — Telehealth: Payer: Self-pay | Admitting: *Deleted

## 2019-08-24 NOTE — Telephone Encounter (Signed)
Patient called and requested results are mailed to the home address.

## 2019-08-30 ENCOUNTER — Telehealth: Payer: Self-pay

## 2019-08-30 NOTE — Telephone Encounter (Signed)
Patient advise that result from 08/17/2019 was not able to determine a result and that she needs to retest.

## 2019-08-31 DIAGNOSIS — Z20828 Contact with and (suspected) exposure to other viral communicable diseases: Secondary | ICD-10-CM | POA: Diagnosis not present

## 2019-10-20 ENCOUNTER — Other Ambulatory Visit: Payer: Medicare HMO | Admitting: Adult Health

## 2019-11-30 DIAGNOSIS — Z1231 Encounter for screening mammogram for malignant neoplasm of breast: Secondary | ICD-10-CM | POA: Diagnosis not present

## 2019-12-02 ENCOUNTER — Telehealth: Payer: Self-pay | Admitting: Adult Health

## 2019-12-02 NOTE — Telephone Encounter (Signed)

## 2019-12-06 ENCOUNTER — Other Ambulatory Visit: Payer: Medicare HMO | Admitting: Adult Health

## 2019-12-17 ENCOUNTER — Other Ambulatory Visit: Payer: Self-pay

## 2019-12-17 ENCOUNTER — Emergency Department (HOSPITAL_COMMUNITY): Payer: Medicare HMO

## 2019-12-17 ENCOUNTER — Encounter (HOSPITAL_COMMUNITY): Payer: Self-pay

## 2019-12-17 ENCOUNTER — Emergency Department (HOSPITAL_COMMUNITY)
Admission: EM | Admit: 2019-12-17 | Discharge: 2019-12-18 | Disposition: A | Discharge status: Home / Self Care | Payer: Medicare HMO | Attending: Emergency Medicine | Admitting: Emergency Medicine

## 2019-12-17 DIAGNOSIS — G629 Polyneuropathy, unspecified: Secondary | ICD-10-CM | POA: Diagnosis not present

## 2019-12-17 DIAGNOSIS — G8929 Other chronic pain: Secondary | ICD-10-CM | POA: Insufficient documentation

## 2019-12-17 DIAGNOSIS — R200 Anesthesia of skin: Secondary | ICD-10-CM | POA: Diagnosis not present

## 2019-12-17 DIAGNOSIS — I10 Essential (primary) hypertension: Secondary | ICD-10-CM | POA: Diagnosis not present

## 2019-12-17 DIAGNOSIS — M5134 Other intervertebral disc degeneration, thoracic region: Secondary | ICD-10-CM | POA: Diagnosis not present

## 2019-12-17 DIAGNOSIS — M545 Low back pain: Secondary | ICD-10-CM | POA: Diagnosis not present

## 2019-12-17 DIAGNOSIS — R202 Paresthesia of skin: Secondary | ICD-10-CM | POA: Diagnosis not present

## 2019-12-17 DIAGNOSIS — Z9181 History of falling: Secondary | ICD-10-CM | POA: Diagnosis not present

## 2019-12-17 DIAGNOSIS — E1165 Type 2 diabetes mellitus with hyperglycemia: Secondary | ICD-10-CM | POA: Diagnosis not present

## 2019-12-17 DIAGNOSIS — E119 Type 2 diabetes mellitus without complications: Secondary | ICD-10-CM | POA: Diagnosis not present

## 2019-12-17 DIAGNOSIS — R339 Retention of urine, unspecified: Secondary | ICD-10-CM | POA: Diagnosis not present

## 2019-12-17 DIAGNOSIS — R69 Illness, unspecified: Secondary | ICD-10-CM | POA: Diagnosis not present

## 2019-12-17 DIAGNOSIS — M797 Fibromyalgia: Secondary | ICD-10-CM | POA: Diagnosis not present

## 2019-12-17 DIAGNOSIS — Z79899 Other long term (current) drug therapy: Secondary | ICD-10-CM | POA: Diagnosis not present

## 2019-12-17 HISTORY — DX: Type 2 diabetes mellitus without complications: E11.9

## 2019-12-17 LAB — CBC WITH DIFFERENTIAL/PLATELET
Abs Immature Granulocytes: 0.02 10*3/uL (ref 0.00–0.07)
Basophils Absolute: 0.1 10*3/uL (ref 0.0–0.1)
Basophils Relative: 1 %
Eosinophils Absolute: 0.2 10*3/uL (ref 0.0–0.5)
Eosinophils Relative: 3 %
HCT: 42 % (ref 36.0–46.0)
Hemoglobin: 14.4 g/dL (ref 12.0–15.0)
Immature Granulocytes: 0 %
Lymphocytes Relative: 36 %
Lymphs Abs: 2.3 10*3/uL (ref 0.7–4.0)
MCH: 29 pg (ref 26.0–34.0)
MCHC: 34.3 g/dL (ref 30.0–36.0)
MCV: 84.5 fL (ref 80.0–100.0)
Monocytes Absolute: 0.5 10*3/uL (ref 0.1–1.0)
Monocytes Relative: 7 %
Neutro Abs: 3.4 10*3/uL (ref 1.7–7.7)
Neutrophils Relative %: 53 %
Platelets: 244 10*3/uL (ref 150–400)
RBC: 4.97 MIL/uL (ref 3.87–5.11)
RDW: 12.7 % (ref 11.5–15.5)
WBC: 6.4 10*3/uL (ref 4.0–10.5)
nRBC: 0 % (ref 0.0–0.2)

## 2019-12-17 LAB — BASIC METABOLIC PANEL
Anion gap: 8 (ref 5–15)
BUN: 17 mg/dL (ref 8–23)
CO2: 24 mmol/L (ref 22–32)
Calcium: 9.1 mg/dL (ref 8.9–10.3)
Chloride: 104 mmol/L (ref 98–111)
Creatinine, Ser: 0.77 mg/dL (ref 0.44–1.00)
GFR calc Af Amer: >60 mL/min (ref >60)
GFR calc non Af Amer: >60 mL/min (ref >60)
Glucose, Bld: 133 mg/dL — ABNORMAL HIGH (ref 70–99)
Potassium: 3.7 mmol/L (ref 3.5–5.1)
Sodium: 136 mmol/L (ref 135–145)

## 2019-12-17 MED ORDER — DICLOFENAC SODIUM 50 MG PO TBEC: 50.0000 mg | DELAYED_RELEASE_TABLET | Freq: Two times a day (BID) | ORAL | 0 refills | Status: AC

## 2019-12-17 MED ORDER — KETOROLAC TROMETHAMINE 60 MG/2ML IM SOLN
60.0000 mg | Freq: Once | INTRAMUSCULAR | Status: AC
  Administered 2019-12-18: 01:00:00 60 mg via INTRAMUSCULAR
  Filled 2019-12-17: qty 2

## 2019-12-17 MED ORDER — METHOCARBAMOL 500 MG PO TABS: 500.0000 mg | ORAL_TABLET | Freq: Four times a day (QID) | ORAL | 0 refills | Status: AC

## 2019-12-17 MED ORDER — LORAZEPAM 2 MG/ML IJ SOLN
1.0000 mg | Freq: Once | INTRAMUSCULAR | Status: AC
  Administered 2019-12-18: 01:00:00 1 mg via INTRAMUSCULAR
  Filled 2019-12-17: qty 1

## 2019-12-17 NOTE — Discharge Instructions (Addendum)
See your Physician for recheck in 2-3 days  

## 2019-12-17 NOTE — ED Provider Notes (Signed)
Pt's care assumed at 10pm. Pt was seen at [facility] earlier today.  Pt reports she had labs and a ct of her head which were normal.  Xray t spine and ls spine show degenerative changes.  Labs reviewed.  Pt has full range of motion lower legs.  Pt states feet feel like she is walking of rocks and legs feel restless.  Pt has sensation to touch.  Pt has no signs of cva,  No symptoms of cauda equina.  Pt is diabetic.  Pt counseled on neuropathy.  Pt request medications for discomfort Pt given ativan 1mg  and toradol IM here.   An After Visit Summary was printed and given to the patient.    Fransico Meadow, Hershal Coria 12/17/19 2346    Milton Ferguson, MD 12/20/19 [phone]

## 2019-12-17 NOTE — ED Provider Notes (Signed)
[facility] EMERGENCY DEPARTMENT Provider Note   CSN: [id number] Arrival date & time: 12/17/19  2055     History Chief Complaint  Patient presents with  . Extremity Weakness    Beverly Contreras is a 64 y.o. female with a history of diabetes, fibromyalgia, depression and vitamin D deficiency presenting with a several day history of bilateral numbness and tingling in her lower extremities buttocks and abdomen.  In addition she describes low back pain which is a chronic condition and not worsened tonight.  She describes numbness but also a tingling sensation along with intermittent sensation of feeling cold in her legs.  She has moderate symptoms in the right leg, her left leg is more severe.  She denies urinary or fecal incontinence or retention.  She denies any new obvious trauma prior to the onset of the symptom.  She was seen at [facility] today where her CBG was checked, reports no other work-up or medication to manage her symptoms.  Upon arriving home she started to develop some spasms in her legs and new she would not be able to sleep so patient presents here.  She denies prior similar symptoms.  She currently takes Lyrica for her fibromyalgia.  She is a new diabetic started on Metformin.  The history is provided by the patient.       Past Medical History:  Diagnosis Date  . Anxiety   . Attention deficit disorder   . Depression   . Diabetes mellitus without complication ([place])   . Fibromyalgia   . Hyperlipidemia   . Vitamin D deficiency     Patient Active Problem List   Diagnosis Date Noted  . Abnormal myocardial perfusion study     Past Surgical History:  Procedure Laterality Date  . [place], reportedly to repair "hole in heart"  . LEFT HEART CATH AND CORONARY ANGIOGRAPHY N/A 06/18/2017   Procedure: LEFT HEART CATH AND CORONARY ANGIOGRAPHY;  Surgeon: Burnell Blanks, MD;  Location: [place] [facility];  Service: Cardiovascular;   Laterality: N/A;  . TUBAL LIGATION       OB History   No obstetric history on file.     Family History  Problem Relation Age of Onset  . Schizophrenia Brother   . Drug abuse Brother   . Alcohol abuse Brother   . Alcohol abuse Brother   . Drug abuse Brother   . Bipolar disorder Daughter   . ADD / ADHD Other   . Drug abuse Other   . Valvular heart disease Mother   . Heart disease Father   . Hypertension Father     Social History   Tobacco Use  . Smoking status: Never Smoker  . Smokeless tobacco: Never Used  Substance Use Topics  . Alcohol use: No    Alcohol/week: 0.0 standard drinks  . Drug use: No    Home Medications Prior to Admission medications   Medication Sig Start Date End Date Taking? Authorizing Provider  clonazePAM (KLONOPIN) 0.5 MG tablet Take 1 tablet (0.5 mg total) by mouth as needed for anxiety. Patient taking differently: Take 0.5 mg by mouth 2 (two) times daily as needed for anxiety.  08/27/16   Cloria Spring, MD  diclofenac (VOLTAREN) 75 MG EC tablet Take 75 mg by mouth 2 (two) times daily.  02/16/15   [provider]  FLUoxetine (PROZAC) 20 MG capsule Take 1 capsule (20 mg total) by mouth daily. 08/27/16 08/27/17  Levonne Spiller  R, MD  methylphenidate (RITALIN) 10 MG tablet Take 1 tablet (10 mg total) by mouth 2 (two) times daily with breakfast and lunch. 08/27/16 08/27/17  Cloria Spring, MD  tiZANidine (ZANAFLEX) 4 MG tablet Take 4 mg by mouth at bedtime.  02/16/15   [provider]    Allergies    Levaquin [levofloxacin in d5w]  Review of Systems   Review of Systems  Constitutional: Negative for chills and fever.  HENT: Negative for congestion and sore throat.   Eyes: Negative.   Respiratory: Negative for chest tightness and shortness of breath.   Cardiovascular: Negative for chest pain.  Gastrointestinal: Negative for abdominal pain and nausea.  Genitourinary: Negative.   Musculoskeletal: Positive for back pain. Negative  for arthralgias, joint swelling and neck pain.  Skin: Negative.  Negative for rash and wound.  Neurological: Positive for numbness. Negative for dizziness, weakness, light-headedness and headaches.  Psychiatric/Behavioral: Negative.     Physical Exam Updated Vital Signs BP (!) 147/69 (BP Location: Left Arm)   Pulse 84   Temp 98.2 F (36.8 C) (Oral)   Resp 18   Ht 5' 4.5" (1.638 m)   Wt 74.8 kg   SpO2 97%   BMI 27.88 kg/m   Physical Exam Vitals and nursing note reviewed.  Constitutional:      Appearance: She is well-developed.  HENT:     Head: Normocephalic.  Eyes:     Conjunctiva/sclera: Conjunctivae normal.  Cardiovascular:     Rate and Rhythm: Normal rate.     Pulses:          Dorsalis pedis pulses are 2+ on the right side and 2+ on the left side.     Comments: Pedal pulses normal. Pulmonary:     Effort: Pulmonary effort is normal.  Abdominal:     General: Bowel sounds are normal. There is no distension.     Palpations: Abdomen is soft. There is no mass.     Tenderness: There is no abdominal tenderness. There is no guarding.  Musculoskeletal:        General: No swelling, tenderness or deformity. Normal range of motion.     Cervical back: Normal range of motion and neck supple.     Lumbar back: No swelling, edema or spasms.     Right lower leg: No edema.     Left lower leg: No edema.  Skin:    General: Skin is warm and dry.     Findings: No erythema or rash.  Neurological:     Mental Status: She is alert.     Sensory: Sensory deficit present.     Motor: No tremor or atrophy.     Coordination: Coordination is intact. Heel to [facility] Test normal.     Gait: Gait normal.     Deep Tendon Reflexes: Babinski sign absent on the right side. Babinski sign absent on the left side.     Reflex Scores:      Patellar reflexes are 2+ on the right side and 2+ on the left side.      Achilles reflexes are 2+ on the right side and 2+ on the left side.    Comments: No strength  deficit noted in hip and knee flexor and extensor muscle groups.  Ankle flexion and extension intact. Normal sensation to sharp touch, reduced light touch sensation to bilateral knees.      ED Results / Procedures / Treatments   Labs (all labs ordered are listed, but only abnormal  results are displayed) Labs Reviewed - No data to display  EKG None  Radiology No results found.  Procedures Procedures (including critical care time)  Medications Ordered in ED Medications - No data to display  ED Course  I have reviewed the triage vital signs and the nursing notes.  Pertinent labs & imaging results that were available during my care of the patient were reviewed by me and considered in my medical decision making (see chart for details).    MDM Rules/Calculators/A&P                      Labs and imaging pending.  Patient with lower torso and bilateral leg numbness/tingling.  VSS, afebrile, doubt infectious source.  No injury preceding sx, but fell since arriving home from her prior ED visit today, was trying to sit in her chair.   Sx did not start peripherally, doubt Guillain Barre.    Discussed pt with Alyse Low, PA who assumes care of pt.   Final Clinical Impression(s) / ED Diagnoses Final diagnoses:  None    Rx / DC Orders ED Discharge Orders    None       Landis Martins 12/17/19 2150    Milton Ferguson, MD 12/17/19 2231

## 2019-12-17 NOTE — ED Triage Notes (Signed)
Pt to er, pt states that she is here for some lower extremity numbness.  States that it started a few days ago and has gotten worse.  States that she was at [facility] earlier today, but when she got home she had some twitching in her legs.  Pt states that she has hx of dm.

## 2019-12-20 DIAGNOSIS — Z789 Other specified health status: Secondary | ICD-10-CM | POA: Diagnosis not present

## 2019-12-20 DIAGNOSIS — E1165 Type 2 diabetes mellitus with hyperglycemia: Secondary | ICD-10-CM | POA: Diagnosis not present

## 2019-12-20 DIAGNOSIS — R32 Unspecified urinary incontinence: Secondary | ICD-10-CM | POA: Diagnosis not present

## 2019-12-20 DIAGNOSIS — I10 Essential (primary) hypertension: Secondary | ICD-10-CM | POA: Diagnosis not present

## 2019-12-20 DIAGNOSIS — R69 Illness, unspecified: Secondary | ICD-10-CM | POA: Diagnosis not present

## 2019-12-20 DIAGNOSIS — Z299 Encounter for prophylactic measures, unspecified: Secondary | ICD-10-CM | POA: Diagnosis not present

## 2019-12-23 DIAGNOSIS — M545 Low back pain: Secondary | ICD-10-CM | POA: Diagnosis not present

## 2019-12-23 DIAGNOSIS — I10 Essential (primary) hypertension: Secondary | ICD-10-CM | POA: Diagnosis not present

## 2019-12-23 DIAGNOSIS — Z299 Encounter for prophylactic measures, unspecified: Secondary | ICD-10-CM | POA: Diagnosis not present

## 2019-12-23 DIAGNOSIS — E1165 Type 2 diabetes mellitus with hyperglycemia: Secondary | ICD-10-CM | POA: Diagnosis not present

## 2019-12-23 DIAGNOSIS — R69 Illness, unspecified: Secondary | ICD-10-CM | POA: Diagnosis not present

## 2019-12-29 DIAGNOSIS — M79671 Pain in right foot: Secondary | ICD-10-CM | POA: Diagnosis not present

## 2019-12-29 DIAGNOSIS — M79672 Pain in left foot: Secondary | ICD-10-CM | POA: Diagnosis not present

## 2019-12-29 DIAGNOSIS — R609 Edema, unspecified: Secondary | ICD-10-CM | POA: Diagnosis not present

## 2019-12-29 DIAGNOSIS — Z299 Encounter for prophylactic measures, unspecified: Secondary | ICD-10-CM | POA: Diagnosis not present

## 2019-12-29 DIAGNOSIS — E1165 Type 2 diabetes mellitus with hyperglycemia: Secondary | ICD-10-CM | POA: Diagnosis not present

## 2019-12-29 DIAGNOSIS — I10 Essential (primary) hypertension: Secondary | ICD-10-CM | POA: Diagnosis not present

## 2019-12-31 DIAGNOSIS — M25562 Pain in left knee: Secondary | ICD-10-CM | POA: Diagnosis not present

## 2019-12-31 DIAGNOSIS — M25561 Pain in right knee: Secondary | ICD-10-CM | POA: Diagnosis not present

## 2020-01-07 DIAGNOSIS — M545 Low back pain: Secondary | ICD-10-CM | POA: Diagnosis not present

## 2020-01-07 DIAGNOSIS — M5127 Other intervertebral disc displacement, lumbosacral region: Secondary | ICD-10-CM | POA: Diagnosis not present

## 2020-01-07 DIAGNOSIS — M5126 Other intervertebral disc displacement, lumbar region: Secondary | ICD-10-CM | POA: Diagnosis not present

## 2020-01-10 DIAGNOSIS — I70209 Unspecified atherosclerosis of native arteries of extremities, unspecified extremity: Secondary | ICD-10-CM | POA: Diagnosis not present

## 2020-01-10 DIAGNOSIS — I70211 Atherosclerosis of native arteries of extremities with intermittent claudication, right leg: Secondary | ICD-10-CM | POA: Diagnosis not present

## 2020-01-14 DIAGNOSIS — Z299 Encounter for prophylactic measures, unspecified: Secondary | ICD-10-CM | POA: Diagnosis not present

## 2020-01-14 DIAGNOSIS — H00019 Hordeolum externum unspecified eye, unspecified eyelid: Secondary | ICD-10-CM | POA: Diagnosis not present

## 2020-01-14 DIAGNOSIS — R69 Illness, unspecified: Secondary | ICD-10-CM | POA: Diagnosis not present

## 2020-01-14 DIAGNOSIS — M5126 Other intervertebral disc displacement, lumbar region: Secondary | ICD-10-CM | POA: Diagnosis not present

## 2020-01-14 DIAGNOSIS — M79606 Pain in leg, unspecified: Secondary | ICD-10-CM | POA: Diagnosis not present

## 2020-01-14 DIAGNOSIS — I10 Essential (primary) hypertension: Secondary | ICD-10-CM | POA: Diagnosis not present

## 2020-02-28 DIAGNOSIS — M4126 Other idiopathic scoliosis, lumbar region: Secondary | ICD-10-CM | POA: Diagnosis not present

## 2020-02-28 DIAGNOSIS — Z6825 Body mass index (BMI) 25.0-25.9, adult: Secondary | ICD-10-CM | POA: Diagnosis not present

## 2020-02-28 DIAGNOSIS — I10 Essential (primary) hypertension: Secondary | ICD-10-CM | POA: Diagnosis not present

## 2020-02-28 DIAGNOSIS — M47816 Spondylosis without myelopathy or radiculopathy, lumbar region: Secondary | ICD-10-CM | POA: Diagnosis not present

## 2020-02-28 DIAGNOSIS — G629 Polyneuropathy, unspecified: Secondary | ICD-10-CM | POA: Diagnosis not present

## 2020-02-28 DIAGNOSIS — M545 Low back pain: Secondary | ICD-10-CM | POA: Diagnosis not present

## 2020-02-28 DIAGNOSIS — M4316 Spondylolisthesis, lumbar region: Secondary | ICD-10-CM | POA: Diagnosis not present

## 2020-02-28 DIAGNOSIS — M5416 Radiculopathy, lumbar region: Secondary | ICD-10-CM | POA: Diagnosis not present

## 2020-03-10 DIAGNOSIS — I10 Essential (primary) hypertension: Secondary | ICD-10-CM | POA: Diagnosis not present

## 2020-03-10 DIAGNOSIS — G629 Polyneuropathy, unspecified: Secondary | ICD-10-CM | POA: Diagnosis not present

## 2020-03-10 DIAGNOSIS — Z6825 Body mass index (BMI) 25.0-25.9, adult: Secondary | ICD-10-CM | POA: Diagnosis not present

## 2020-03-10 DIAGNOSIS — Z1211 Encounter for screening for malignant neoplasm of colon: Secondary | ICD-10-CM | POA: Diagnosis not present

## 2020-03-10 DIAGNOSIS — H00019 Hordeolum externum unspecified eye, unspecified eyelid: Secondary | ICD-10-CM | POA: Diagnosis not present

## 2020-03-10 DIAGNOSIS — Z1331 Encounter for screening for depression: Secondary | ICD-10-CM | POA: Diagnosis not present

## 2020-03-10 DIAGNOSIS — Z0000 Encounter for general adult medical examination without abnormal findings: Secondary | ICD-10-CM | POA: Diagnosis not present

## 2020-03-10 DIAGNOSIS — E1165 Type 2 diabetes mellitus with hyperglycemia: Secondary | ICD-10-CM | POA: Diagnosis not present

## 2020-03-10 DIAGNOSIS — Z1339 Encounter for screening examination for other mental health and behavioral disorders: Secondary | ICD-10-CM | POA: Diagnosis not present

## 2020-03-10 DIAGNOSIS — Z7189 Other specified counseling: Secondary | ICD-10-CM | POA: Diagnosis not present

## 2020-03-17 DIAGNOSIS — R5383 Other fatigue: Secondary | ICD-10-CM | POA: Diagnosis not present

## 2020-03-17 DIAGNOSIS — E7800 Pure hypercholesterolemia, unspecified: Secondary | ICD-10-CM | POA: Diagnosis not present

## 2020-03-17 DIAGNOSIS — I10 Essential (primary) hypertension: Secondary | ICD-10-CM | POA: Diagnosis not present

## 2020-04-04 DIAGNOSIS — M47816 Spondylosis without myelopathy or radiculopathy, lumbar region: Secondary | ICD-10-CM | POA: Diagnosis not present

## 2020-04-19 DIAGNOSIS — H00019 Hordeolum externum unspecified eye, unspecified eyelid: Secondary | ICD-10-CM | POA: Diagnosis not present

## 2020-04-19 DIAGNOSIS — Z299 Encounter for prophylactic measures, unspecified: Secondary | ICD-10-CM | POA: Diagnosis not present

## 2020-04-19 DIAGNOSIS — E1140 Type 2 diabetes mellitus with diabetic neuropathy, unspecified: Secondary | ICD-10-CM | POA: Diagnosis not present

## 2020-04-19 DIAGNOSIS — R69 Illness, unspecified: Secondary | ICD-10-CM | POA: Diagnosis not present

## 2020-04-19 DIAGNOSIS — E1165 Type 2 diabetes mellitus with hyperglycemia: Secondary | ICD-10-CM | POA: Diagnosis not present

## 2020-04-19 DIAGNOSIS — I10 Essential (primary) hypertension: Secondary | ICD-10-CM | POA: Diagnosis not present

## 2020-04-19 DIAGNOSIS — E119 Type 2 diabetes mellitus without complications: Secondary | ICD-10-CM | POA: Diagnosis not present

## 2020-04-20 ENCOUNTER — Encounter (INDEPENDENT_AMBULATORY_CARE_PROVIDER_SITE_OTHER): Payer: Self-pay | Admitting: *Deleted

## 2020-04-26 DIAGNOSIS — M797 Fibromyalgia: Secondary | ICD-10-CM | POA: Diagnosis not present

## 2020-04-26 DIAGNOSIS — M47816 Spondylosis without myelopathy or radiculopathy, lumbar region: Secondary | ICD-10-CM | POA: Diagnosis not present

## 2020-04-26 DIAGNOSIS — Z789 Other specified health status: Secondary | ICD-10-CM | POA: Diagnosis not present

## 2020-04-26 DIAGNOSIS — L820 Inflamed seborrheic keratosis: Secondary | ICD-10-CM | POA: Diagnosis not present

## 2020-04-26 DIAGNOSIS — E1165 Type 2 diabetes mellitus with hyperglycemia: Secondary | ICD-10-CM | POA: Diagnosis not present

## 2020-04-26 DIAGNOSIS — M5136 Other intervertebral disc degeneration, lumbar region: Secondary | ICD-10-CM | POA: Diagnosis not present

## 2020-04-26 DIAGNOSIS — G629 Polyneuropathy, unspecified: Secondary | ICD-10-CM | POA: Diagnosis not present

## 2020-04-26 DIAGNOSIS — H00019 Hordeolum externum unspecified eye, unspecified eyelid: Secondary | ICD-10-CM | POA: Diagnosis not present

## 2020-04-26 DIAGNOSIS — Z299 Encounter for prophylactic measures, unspecified: Secondary | ICD-10-CM | POA: Diagnosis not present

## 2020-04-26 DIAGNOSIS — D485 Neoplasm of uncertain behavior of skin: Secondary | ICD-10-CM | POA: Diagnosis not present

## 2020-04-26 DIAGNOSIS — I10 Essential (primary) hypertension: Secondary | ICD-10-CM | POA: Diagnosis not present

## 2020-04-26 DIAGNOSIS — R030 Elevated blood-pressure reading, without diagnosis of hypertension: Secondary | ICD-10-CM | POA: Diagnosis not present

## 2020-05-19 DIAGNOSIS — H00019 Hordeolum externum unspecified eye, unspecified eyelid: Secondary | ICD-10-CM | POA: Diagnosis not present

## 2020-05-25 DIAGNOSIS — M5136 Other intervertebral disc degeneration, lumbar region: Secondary | ICD-10-CM | POA: Diagnosis not present

## 2020-06-02 DIAGNOSIS — R69 Illness, unspecified: Secondary | ICD-10-CM | POA: Diagnosis not present

## 2020-06-26 DIAGNOSIS — M47816 Spondylosis without myelopathy or radiculopathy, lumbar region: Secondary | ICD-10-CM | POA: Diagnosis not present

## 2020-06-26 DIAGNOSIS — M5136 Other intervertebral disc degeneration, lumbar region: Secondary | ICD-10-CM | POA: Diagnosis not present

## 2020-07-17 DIAGNOSIS — M47816 Spondylosis without myelopathy or radiculopathy, lumbar region: Secondary | ICD-10-CM | POA: Diagnosis not present

## 2020-07-26 DIAGNOSIS — J019 Acute sinusitis, unspecified: Secondary | ICD-10-CM | POA: Diagnosis not present

## 2020-07-26 DIAGNOSIS — Z299 Encounter for prophylactic measures, unspecified: Secondary | ICD-10-CM | POA: Diagnosis not present

## 2020-07-26 DIAGNOSIS — E1165 Type 2 diabetes mellitus with hyperglycemia: Secondary | ICD-10-CM | POA: Diagnosis not present

## 2020-07-26 DIAGNOSIS — R69 Illness, unspecified: Secondary | ICD-10-CM | POA: Diagnosis not present

## 2020-07-26 DIAGNOSIS — E1142 Type 2 diabetes mellitus with diabetic polyneuropathy: Secondary | ICD-10-CM | POA: Diagnosis not present

## 2020-07-31 DIAGNOSIS — Z1159 Encounter for screening for other viral diseases: Secondary | ICD-10-CM | POA: Diagnosis not present

## 2020-08-15 DIAGNOSIS — M5136 Other intervertebral disc degeneration, lumbar region: Secondary | ICD-10-CM | POA: Diagnosis not present

## 2020-08-15 DIAGNOSIS — M47816 Spondylosis without myelopathy or radiculopathy, lumbar region: Secondary | ICD-10-CM | POA: Diagnosis not present

## 2020-08-15 DIAGNOSIS — G629 Polyneuropathy, unspecified: Secondary | ICD-10-CM | POA: Diagnosis not present

## 2020-08-17 DIAGNOSIS — R69 Illness, unspecified: Secondary | ICD-10-CM | POA: Diagnosis not present

## 2020-08-17 DIAGNOSIS — E1142 Type 2 diabetes mellitus with diabetic polyneuropathy: Secondary | ICD-10-CM | POA: Diagnosis not present

## 2020-08-17 DIAGNOSIS — G629 Polyneuropathy, unspecified: Secondary | ICD-10-CM | POA: Diagnosis not present

## 2020-08-17 DIAGNOSIS — Z299 Encounter for prophylactic measures, unspecified: Secondary | ICD-10-CM | POA: Diagnosis not present

## 2020-08-17 DIAGNOSIS — I10 Essential (primary) hypertension: Secondary | ICD-10-CM | POA: Diagnosis not present

## 2020-08-17 DIAGNOSIS — E1165 Type 2 diabetes mellitus with hyperglycemia: Secondary | ICD-10-CM | POA: Diagnosis not present

## 2020-09-18 DIAGNOSIS — E1129 Type 2 diabetes mellitus with other diabetic kidney complication: Secondary | ICD-10-CM | POA: Diagnosis not present

## 2020-09-18 DIAGNOSIS — Z299 Encounter for prophylactic measures, unspecified: Secondary | ICD-10-CM | POA: Diagnosis not present

## 2020-09-18 DIAGNOSIS — R059 Cough, unspecified: Secondary | ICD-10-CM | POA: Diagnosis not present

## 2020-09-18 DIAGNOSIS — J329 Chronic sinusitis, unspecified: Secondary | ICD-10-CM | POA: Diagnosis not present

## 2020-09-18 DIAGNOSIS — R69 Illness, unspecified: Secondary | ICD-10-CM | POA: Diagnosis not present

## 2020-09-19 DIAGNOSIS — M5136 Other intervertebral disc degeneration, lumbar region: Secondary | ICD-10-CM | POA: Diagnosis not present

## 2020-09-19 DIAGNOSIS — M5416 Radiculopathy, lumbar region: Secondary | ICD-10-CM | POA: Diagnosis not present

## 2020-09-22 DIAGNOSIS — R6889 Other general symptoms and signs: Secondary | ICD-10-CM | POA: Diagnosis not present

## 2020-10-16 DIAGNOSIS — I10 Essential (primary) hypertension: Secondary | ICD-10-CM | POA: Diagnosis not present

## 2020-10-16 DIAGNOSIS — E1165 Type 2 diabetes mellitus with hyperglycemia: Secondary | ICD-10-CM | POA: Diagnosis not present

## 2020-10-16 DIAGNOSIS — M797 Fibromyalgia: Secondary | ICD-10-CM | POA: Diagnosis not present

## 2020-10-25 DIAGNOSIS — M48061 Spinal stenosis, lumbar region without neurogenic claudication: Secondary | ICD-10-CM | POA: Diagnosis not present

## 2020-10-25 DIAGNOSIS — M5136 Other intervertebral disc degeneration, lumbar region: Secondary | ICD-10-CM | POA: Diagnosis not present

## 2020-10-25 DIAGNOSIS — M5416 Radiculopathy, lumbar region: Secondary | ICD-10-CM | POA: Diagnosis not present

## 2020-10-25 DIAGNOSIS — G629 Polyneuropathy, unspecified: Secondary | ICD-10-CM | POA: Diagnosis not present

## 2020-10-27 DIAGNOSIS — M5136 Other intervertebral disc degeneration, lumbar region: Secondary | ICD-10-CM | POA: Diagnosis not present

## 2020-11-03 DIAGNOSIS — M5136 Other intervertebral disc degeneration, lumbar region: Secondary | ICD-10-CM | POA: Diagnosis not present

## 2020-11-06 DIAGNOSIS — R809 Proteinuria, unspecified: Secondary | ICD-10-CM | POA: Diagnosis not present

## 2020-11-06 DIAGNOSIS — Z299 Encounter for prophylactic measures, unspecified: Secondary | ICD-10-CM | POA: Diagnosis not present

## 2020-11-06 DIAGNOSIS — S90222A Contusion of left lesser toe(s) with damage to nail, initial encounter: Secondary | ICD-10-CM | POA: Diagnosis not present

## 2020-11-06 DIAGNOSIS — E1129 Type 2 diabetes mellitus with other diabetic kidney complication: Secondary | ICD-10-CM | POA: Diagnosis not present

## 2020-11-11 IMAGING — DX DG LUMBAR SPINE COMPLETE 4+V
5 series · 5 of 5 positions shown · non-contrast
Comparison: None.

CLINICAL DATA: Low back pain. Lower extremity numbness.

EXAM:
LUMBAR SPINE - COMPLETE 4+ VIEW

[l-spine ap]
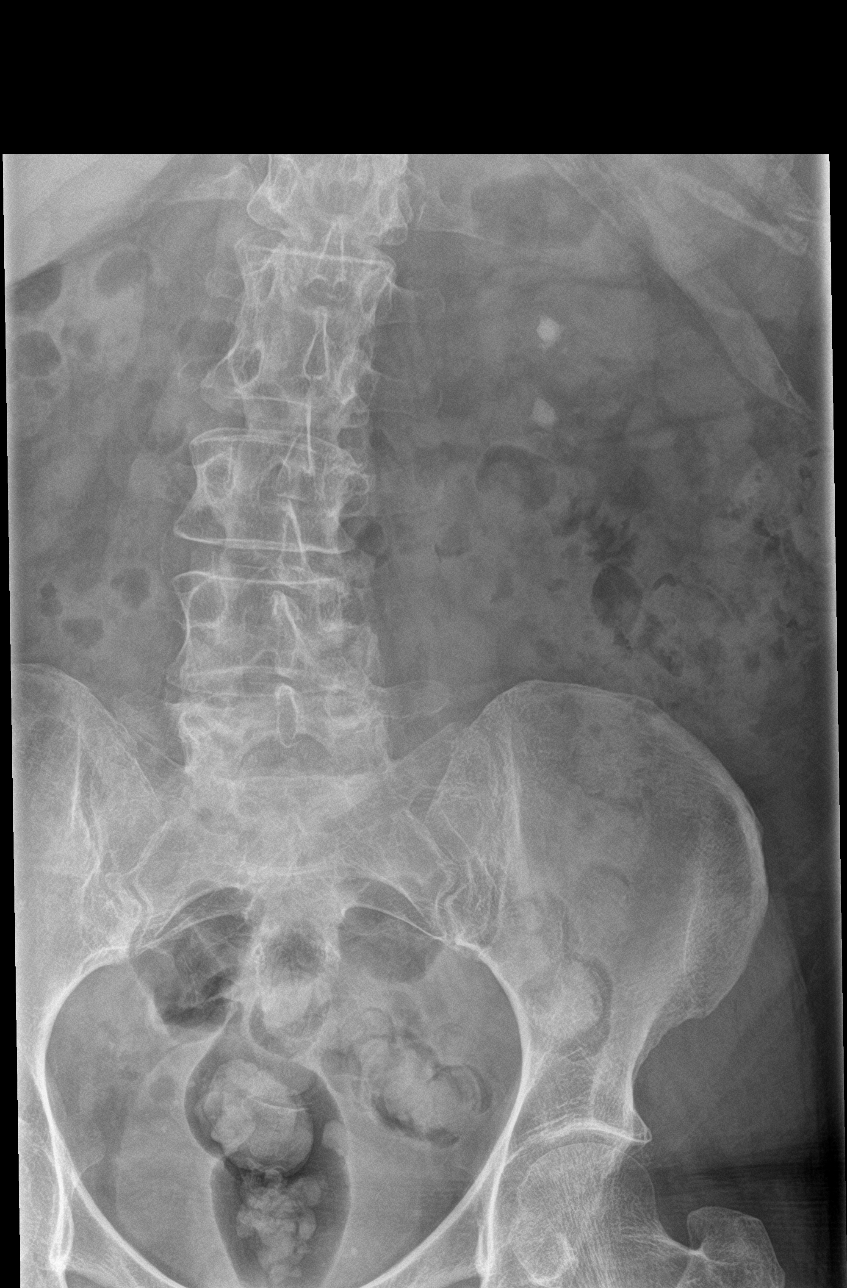

[l-spine obl (1 of 2)]
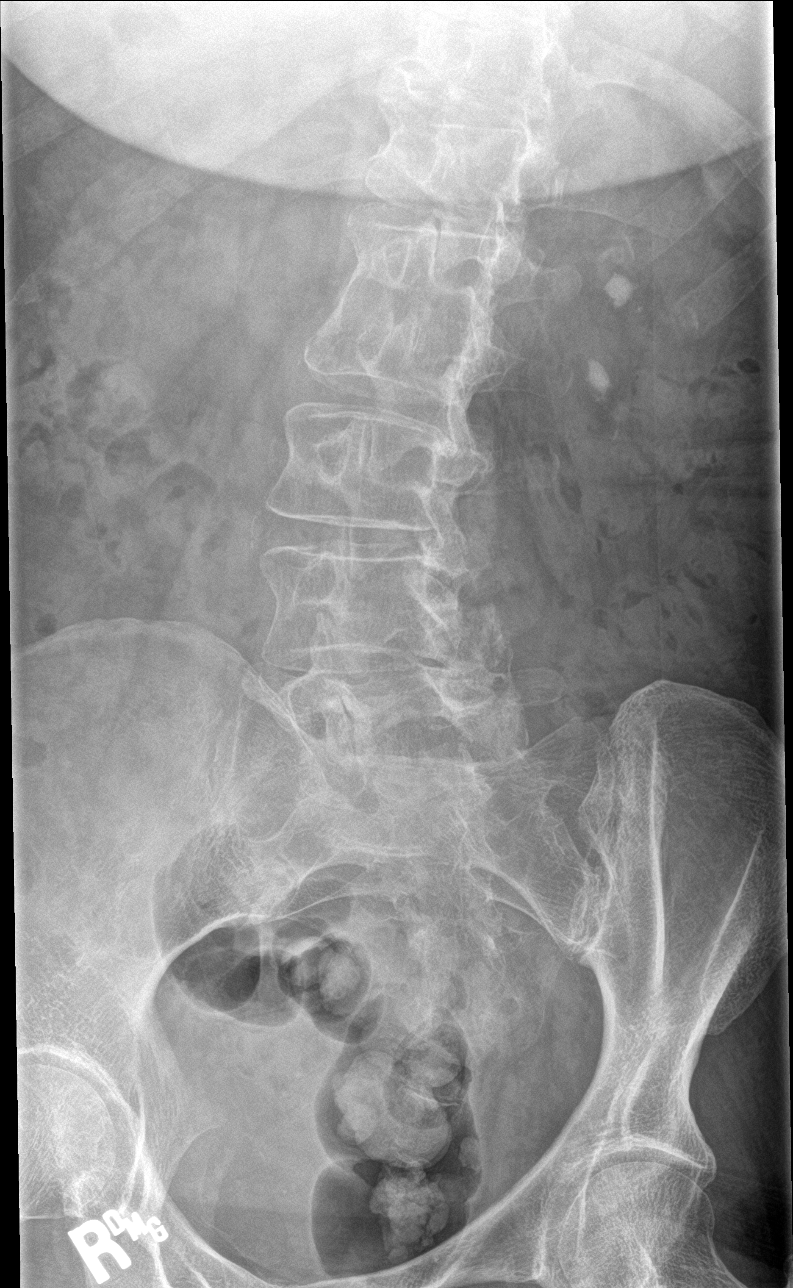

[l-spine obl (2 of 2)]
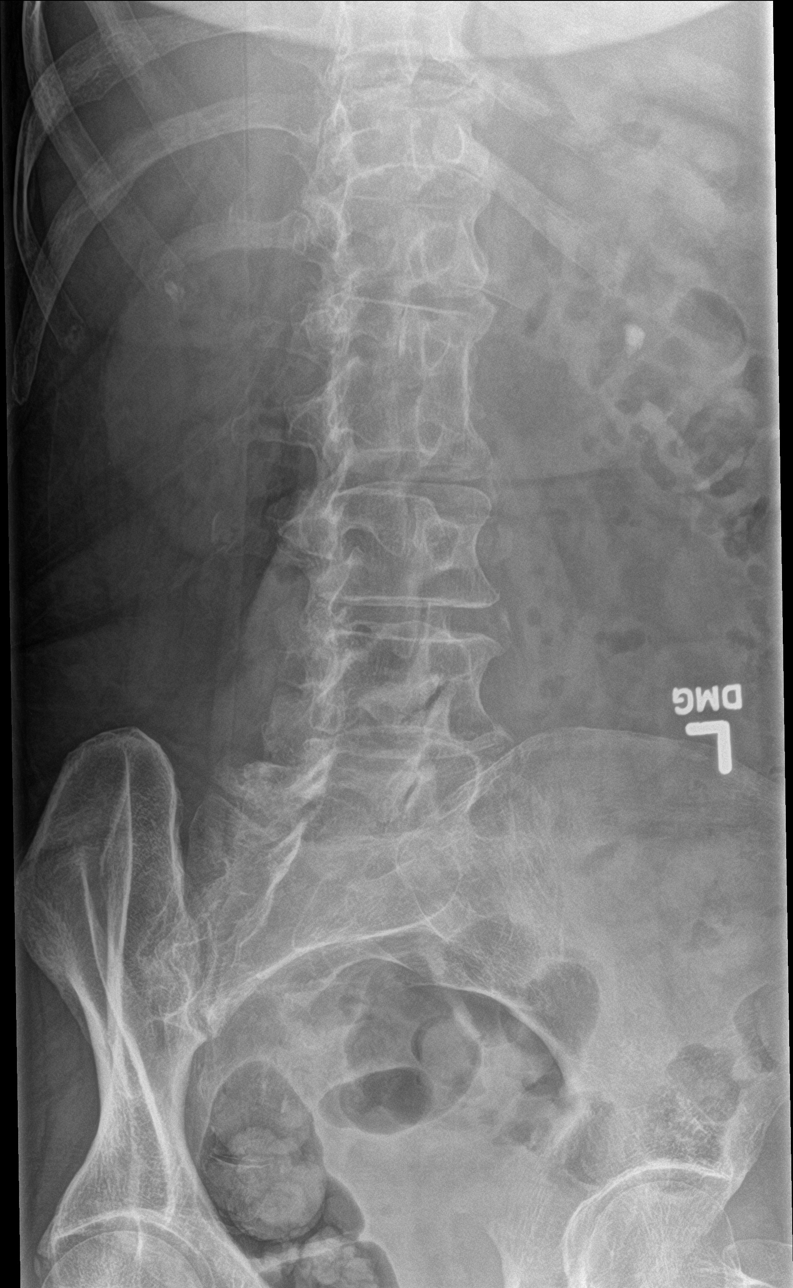

[l-spine lat]
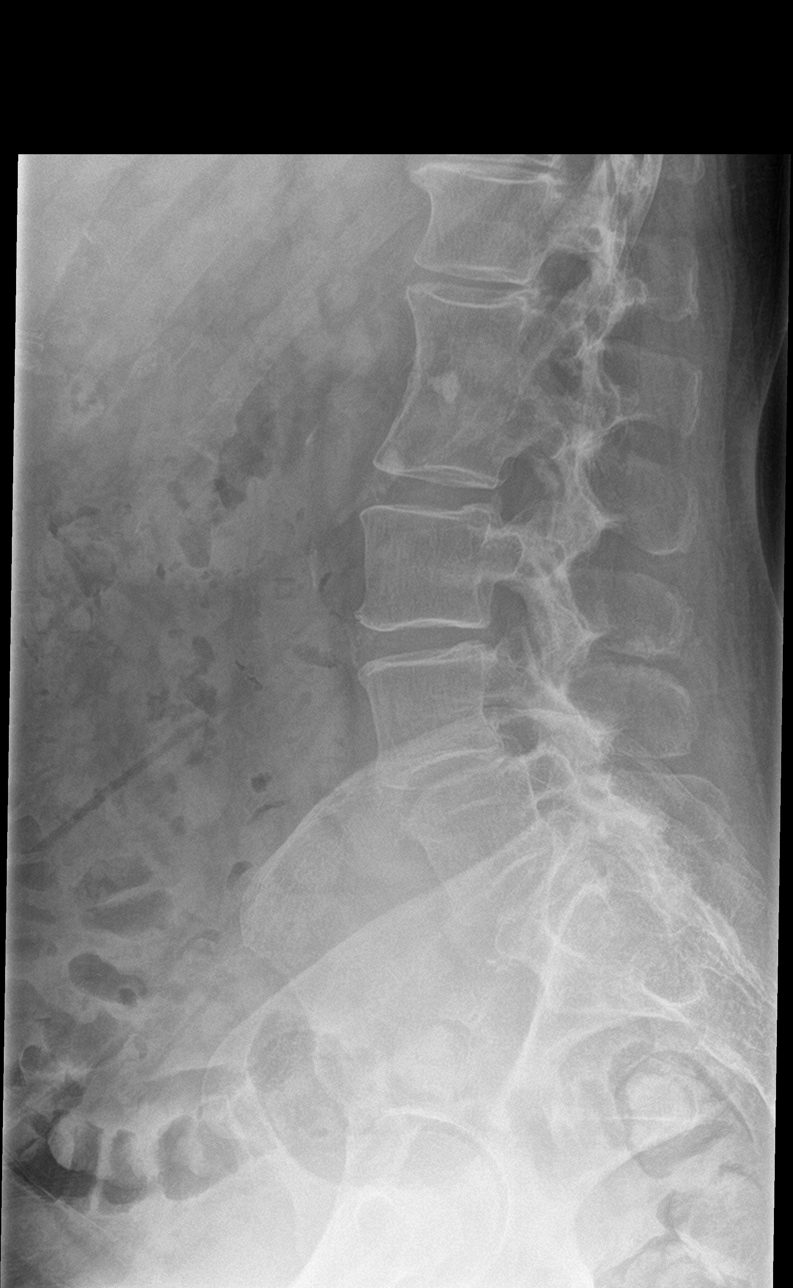

[l-spine spot]
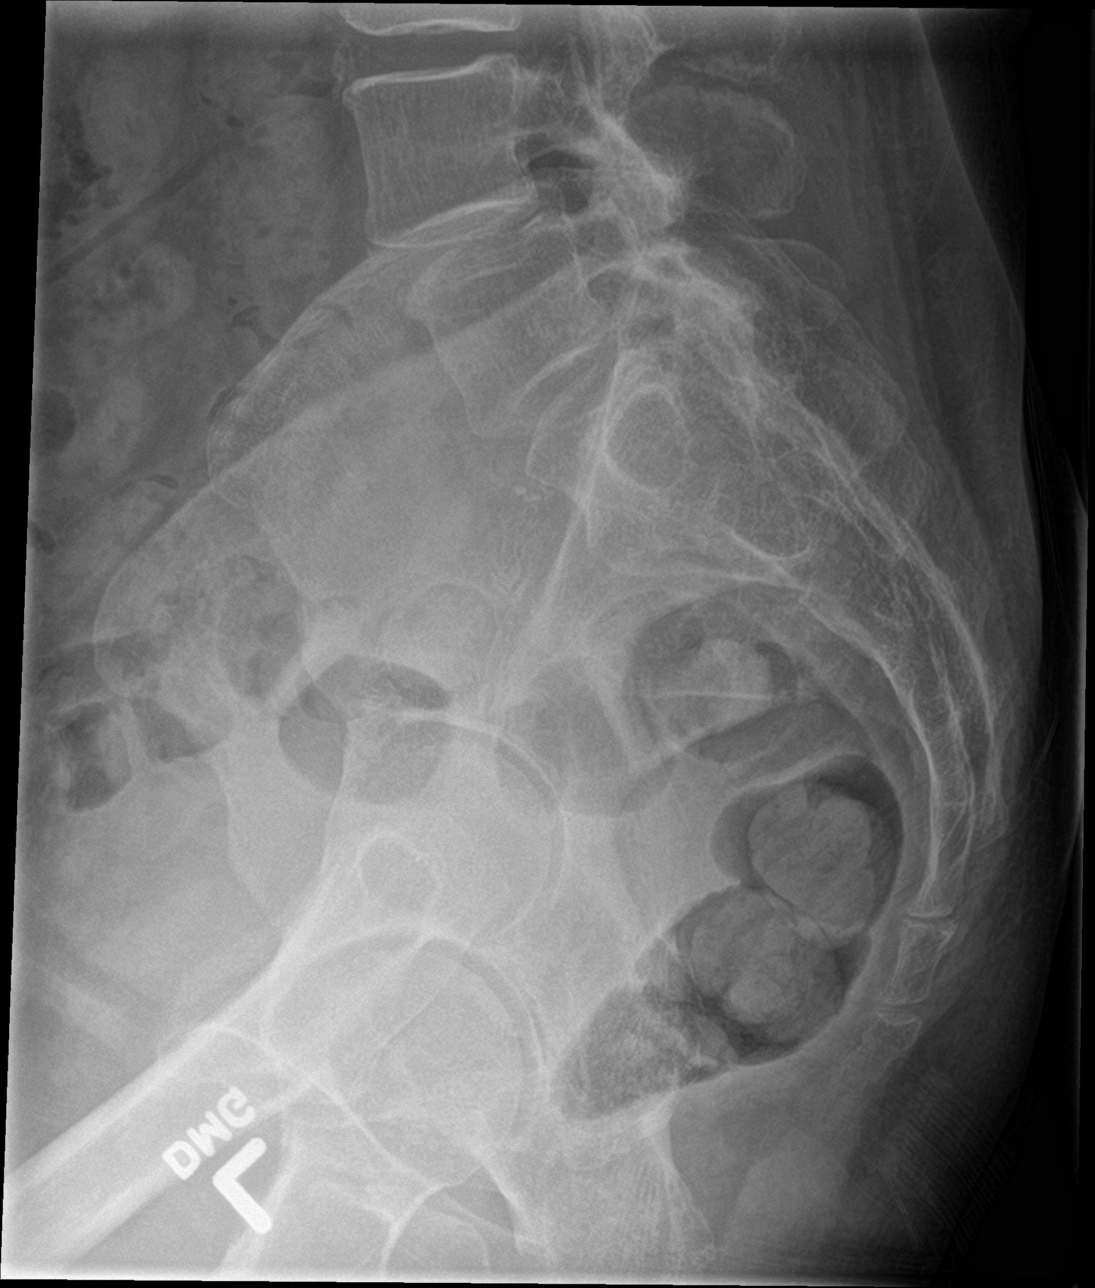

[5 of 5 positions shown; findings below may reference images not displayed]

FINDINGS: Broad-based rightward curvature of the lumbar spine. 4 mm
anterolisthesis of L4 on L5. Fusion of L1 and L2 vertebral bodies is
likely congenital. No acute or vertebral body compression fracture.
Disc space narrowing and endplate spurring at L4-L5 and L5-S1.
Minimal lower lumbar facet hypertrophy. Multiple calcifications
project over the left renal bed suspicious for nephrolithiasis.
IMPRESSION: 1. Degenerative change in the lumbar spine with 4 mm anterolisthesis
of L4 on L5. Mild broad-based dextroscoliotic curvature.
2. Fusion L1 and L2 vertebral bodies appears congenital.
3. Probable left nephrolithiasis.

## 2020-11-17 DIAGNOSIS — E1165 Type 2 diabetes mellitus with hyperglycemia: Secondary | ICD-10-CM | POA: Diagnosis not present

## 2020-11-17 DIAGNOSIS — R69 Illness, unspecified: Secondary | ICD-10-CM | POA: Diagnosis not present

## 2020-11-17 DIAGNOSIS — E1142 Type 2 diabetes mellitus with diabetic polyneuropathy: Secondary | ICD-10-CM | POA: Diagnosis not present

## 2020-11-17 DIAGNOSIS — I10 Essential (primary) hypertension: Secondary | ICD-10-CM | POA: Diagnosis not present

## 2020-11-17 DIAGNOSIS — Z299 Encounter for prophylactic measures, unspecified: Secondary | ICD-10-CM | POA: Diagnosis not present

## 2021-01-16 DIAGNOSIS — I10 Essential (primary) hypertension: Secondary | ICD-10-CM | POA: Diagnosis not present

## 2021-01-16 DIAGNOSIS — J069 Acute upper respiratory infection, unspecified: Secondary | ICD-10-CM | POA: Diagnosis not present

## 2021-01-16 DIAGNOSIS — Z299 Encounter for prophylactic measures, unspecified: Secondary | ICD-10-CM | POA: Diagnosis not present

## 2021-01-16 DIAGNOSIS — M797 Fibromyalgia: Secondary | ICD-10-CM | POA: Diagnosis not present

## 2021-01-16 DIAGNOSIS — M542 Cervicalgia: Secondary | ICD-10-CM | POA: Diagnosis not present

## 2021-02-06 DIAGNOSIS — M5416 Radiculopathy, lumbar region: Secondary | ICD-10-CM | POA: Diagnosis not present

## 2021-02-27 DIAGNOSIS — E1140 Type 2 diabetes mellitus with diabetic neuropathy, unspecified: Secondary | ICD-10-CM | POA: Diagnosis not present

## 2021-02-27 DIAGNOSIS — Z299 Encounter for prophylactic measures, unspecified: Secondary | ICD-10-CM | POA: Diagnosis not present

## 2021-02-27 DIAGNOSIS — I10 Essential (primary) hypertension: Secondary | ICD-10-CM | POA: Diagnosis not present

## 2021-02-27 DIAGNOSIS — D692 Other nonthrombocytopenic purpura: Secondary | ICD-10-CM | POA: Diagnosis not present

## 2021-02-27 DIAGNOSIS — E1165 Type 2 diabetes mellitus with hyperglycemia: Secondary | ICD-10-CM | POA: Diagnosis not present

## 2021-03-15 DIAGNOSIS — E1165 Type 2 diabetes mellitus with hyperglycemia: Secondary | ICD-10-CM | POA: Diagnosis not present

## 2021-03-15 DIAGNOSIS — M797 Fibromyalgia: Secondary | ICD-10-CM | POA: Diagnosis not present

## 2021-03-15 DIAGNOSIS — I10 Essential (primary) hypertension: Secondary | ICD-10-CM | POA: Diagnosis not present

## 2021-05-16 DIAGNOSIS — E1165 Type 2 diabetes mellitus with hyperglycemia: Secondary | ICD-10-CM | POA: Diagnosis not present

## 2021-05-16 DIAGNOSIS — I10 Essential (primary) hypertension: Secondary | ICD-10-CM | POA: Diagnosis not present

## 2021-05-16 DIAGNOSIS — M797 Fibromyalgia: Secondary | ICD-10-CM | POA: Diagnosis not present

## 2021-06-05 DIAGNOSIS — Z1331 Encounter for screening for depression: Secondary | ICD-10-CM | POA: Diagnosis not present

## 2021-06-05 DIAGNOSIS — R69 Illness, unspecified: Secondary | ICD-10-CM | POA: Diagnosis not present

## 2021-06-05 DIAGNOSIS — Z0000 Encounter for general adult medical examination without abnormal findings: Secondary | ICD-10-CM | POA: Diagnosis not present

## 2021-06-05 DIAGNOSIS — I10 Essential (primary) hypertension: Secondary | ICD-10-CM | POA: Diagnosis not present

## 2021-06-05 DIAGNOSIS — Z1339 Encounter for screening examination for other mental health and behavioral disorders: Secondary | ICD-10-CM | POA: Diagnosis not present

## 2021-06-05 DIAGNOSIS — Z7189 Other specified counseling: Secondary | ICD-10-CM | POA: Diagnosis not present

## 2021-06-05 DIAGNOSIS — Z299 Encounter for prophylactic measures, unspecified: Secondary | ICD-10-CM | POA: Diagnosis not present

## 2021-06-05 DIAGNOSIS — Z6823 Body mass index (BMI) 23.0-23.9, adult: Secondary | ICD-10-CM | POA: Diagnosis not present

## 2021-06-05 DIAGNOSIS — E1165 Type 2 diabetes mellitus with hyperglycemia: Secondary | ICD-10-CM | POA: Diagnosis not present

## 2021-06-15 DIAGNOSIS — M797 Fibromyalgia: Secondary | ICD-10-CM | POA: Diagnosis not present

## 2021-06-15 DIAGNOSIS — E559 Vitamin D deficiency, unspecified: Secondary | ICD-10-CM | POA: Diagnosis not present

## 2021-06-25 ENCOUNTER — Other Ambulatory Visit: Payer: Self-pay | Admitting: Internal Medicine

## 2021-06-25 DIAGNOSIS — Z139 Encounter for screening, unspecified: Secondary | ICD-10-CM

## 2021-06-29 DIAGNOSIS — R69 Illness, unspecified: Secondary | ICD-10-CM | POA: Diagnosis not present

## 2021-06-29 DIAGNOSIS — E7800 Pure hypercholesterolemia, unspecified: Secondary | ICD-10-CM | POA: Diagnosis not present

## 2021-06-29 DIAGNOSIS — Z79899 Other long term (current) drug therapy: Secondary | ICD-10-CM | POA: Diagnosis not present

## 2021-06-29 DIAGNOSIS — F419 Anxiety disorder, unspecified: Secondary | ICD-10-CM | POA: Diagnosis not present

## 2021-06-29 DIAGNOSIS — Z0000 Encounter for general adult medical examination without abnormal findings: Secondary | ICD-10-CM | POA: Diagnosis not present

## 2021-08-27 DIAGNOSIS — J309 Allergic rhinitis, unspecified: Secondary | ICD-10-CM | POA: Diagnosis not present

## 2021-08-27 DIAGNOSIS — J101 Influenza due to other identified influenza virus with other respiratory manifestations: Secondary | ICD-10-CM | POA: Diagnosis not present

## 2021-08-27 DIAGNOSIS — Z6823 Body mass index (BMI) 23.0-23.9, adult: Secondary | ICD-10-CM | POA: Diagnosis not present

## 2021-08-27 DIAGNOSIS — Z299 Encounter for prophylactic measures, unspecified: Secondary | ICD-10-CM | POA: Diagnosis not present

## 2021-08-27 DIAGNOSIS — J029 Acute pharyngitis, unspecified: Secondary | ICD-10-CM | POA: Diagnosis not present

## 2021-09-05 DIAGNOSIS — Z6823 Body mass index (BMI) 23.0-23.9, adult: Secondary | ICD-10-CM | POA: Diagnosis not present

## 2021-09-05 DIAGNOSIS — R059 Cough, unspecified: Secondary | ICD-10-CM | POA: Diagnosis not present

## 2021-09-05 DIAGNOSIS — J069 Acute upper respiratory infection, unspecified: Secondary | ICD-10-CM | POA: Diagnosis not present

## 2021-09-05 DIAGNOSIS — I10 Essential (primary) hypertension: Secondary | ICD-10-CM | POA: Diagnosis not present

## 2021-09-05 DIAGNOSIS — Z299 Encounter for prophylactic measures, unspecified: Secondary | ICD-10-CM | POA: Diagnosis not present

## 2021-11-19 DIAGNOSIS — I10 Essential (primary) hypertension: Secondary | ICD-10-CM | POA: Diagnosis not present

## 2021-11-19 DIAGNOSIS — Z299 Encounter for prophylactic measures, unspecified: Secondary | ICD-10-CM | POA: Diagnosis not present

## 2021-11-19 DIAGNOSIS — J329 Chronic sinusitis, unspecified: Secondary | ICD-10-CM | POA: Diagnosis not present

## 2021-11-19 DIAGNOSIS — E1165 Type 2 diabetes mellitus with hyperglycemia: Secondary | ICD-10-CM | POA: Diagnosis not present

## 2022-01-19 DIAGNOSIS — H524 Presbyopia: Secondary | ICD-10-CM | POA: Diagnosis not present

## 2022-01-19 DIAGNOSIS — H52223 Regular astigmatism, bilateral: Secondary | ICD-10-CM | POA: Diagnosis not present

## 2022-01-19 DIAGNOSIS — Z961 Presence of intraocular lens: Secondary | ICD-10-CM | POA: Diagnosis not present

## 2022-04-25 DIAGNOSIS — R52 Pain, unspecified: Secondary | ICD-10-CM | POA: Diagnosis not present

## 2022-04-25 DIAGNOSIS — I10 Essential (primary) hypertension: Secondary | ICD-10-CM | POA: Diagnosis not present

## 2022-04-25 DIAGNOSIS — G629 Polyneuropathy, unspecified: Secondary | ICD-10-CM | POA: Diagnosis not present

## 2022-04-25 DIAGNOSIS — Z299 Encounter for prophylactic measures, unspecified: Secondary | ICD-10-CM | POA: Diagnosis not present

## 2022-04-25 DIAGNOSIS — Z789 Other specified health status: Secondary | ICD-10-CM | POA: Diagnosis not present

## 2022-04-25 DIAGNOSIS — M25512 Pain in left shoulder: Secondary | ICD-10-CM | POA: Diagnosis not present

## 2022-04-25 DIAGNOSIS — Z6824 Body mass index (BMI) 24.0-24.9, adult: Secondary | ICD-10-CM | POA: Diagnosis not present

## 2022-04-25 DIAGNOSIS — R7303 Prediabetes: Secondary | ICD-10-CM | POA: Diagnosis not present

## 2022-11-22 DIAGNOSIS — M159 Polyosteoarthritis, unspecified: Secondary | ICD-10-CM | POA: Diagnosis not present

## 2022-11-22 DIAGNOSIS — I10 Essential (primary) hypertension: Secondary | ICD-10-CM | POA: Diagnosis not present

## 2022-11-22 DIAGNOSIS — Z299 Encounter for prophylactic measures, unspecified: Secondary | ICD-10-CM | POA: Diagnosis not present

## 2022-11-22 DIAGNOSIS — F3342 Major depressive disorder, recurrent, in full remission: Secondary | ICD-10-CM | POA: Diagnosis not present

## 2022-11-22 DIAGNOSIS — E1165 Type 2 diabetes mellitus with hyperglycemia: Secondary | ICD-10-CM | POA: Diagnosis not present

## 2022-12-02 DIAGNOSIS — E119 Type 2 diabetes mellitus without complications: Secondary | ICD-10-CM | POA: Diagnosis not present

## 2022-12-19 DIAGNOSIS — Z1331 Encounter for screening for depression: Secondary | ICD-10-CM | POA: Diagnosis not present

## 2022-12-19 DIAGNOSIS — Z299 Encounter for prophylactic measures, unspecified: Secondary | ICD-10-CM | POA: Diagnosis not present

## 2022-12-19 DIAGNOSIS — R5383 Other fatigue: Secondary | ICD-10-CM | POA: Diagnosis not present

## 2022-12-19 DIAGNOSIS — I10 Essential (primary) hypertension: Secondary | ICD-10-CM | POA: Diagnosis not present

## 2022-12-19 DIAGNOSIS — F3342 Major depressive disorder, recurrent, in full remission: Secondary | ICD-10-CM | POA: Diagnosis not present

## 2022-12-19 DIAGNOSIS — Z7189 Other specified counseling: Secondary | ICD-10-CM | POA: Diagnosis not present

## 2022-12-19 DIAGNOSIS — Z0000 Encounter for general adult medical examination without abnormal findings: Secondary | ICD-10-CM | POA: Diagnosis not present

## 2022-12-19 DIAGNOSIS — Z6825 Body mass index (BMI) 25.0-25.9, adult: Secondary | ICD-10-CM | POA: Diagnosis not present

## 2022-12-19 DIAGNOSIS — Z1339 Encounter for screening examination for other mental health and behavioral disorders: Secondary | ICD-10-CM | POA: Diagnosis not present

## 2023-01-02 DIAGNOSIS — Z1211 Encounter for screening for malignant neoplasm of colon: Secondary | ICD-10-CM | POA: Diagnosis not present

## 2023-01-02 DIAGNOSIS — Z1212 Encounter for screening for malignant neoplasm of rectum: Secondary | ICD-10-CM | POA: Diagnosis not present

## 2023-01-12 LAB — COLOGUARD: COLOGUARD: NEGATIVE

## 2023-02-20 DIAGNOSIS — Z1231 Encounter for screening mammogram for malignant neoplasm of breast: Secondary | ICD-10-CM | POA: Diagnosis not present

## 2023-02-21 DIAGNOSIS — Z299 Encounter for prophylactic measures, unspecified: Secondary | ICD-10-CM | POA: Diagnosis not present

## 2023-02-21 DIAGNOSIS — Z0000 Encounter for general adult medical examination without abnormal findings: Secondary | ICD-10-CM | POA: Diagnosis not present

## 2023-02-21 DIAGNOSIS — F3342 Major depressive disorder, recurrent, in full remission: Secondary | ICD-10-CM | POA: Diagnosis not present

## 2023-02-21 DIAGNOSIS — E1165 Type 2 diabetes mellitus with hyperglycemia: Secondary | ICD-10-CM | POA: Diagnosis not present

## 2023-02-21 DIAGNOSIS — E1140 Type 2 diabetes mellitus with diabetic neuropathy, unspecified: Secondary | ICD-10-CM | POA: Diagnosis not present

## 2023-02-21 DIAGNOSIS — I10 Essential (primary) hypertension: Secondary | ICD-10-CM | POA: Diagnosis not present

## 2023-02-25 DIAGNOSIS — E7800 Pure hypercholesterolemia, unspecified: Secondary | ICD-10-CM | POA: Diagnosis not present

## 2023-02-25 DIAGNOSIS — R5383 Other fatigue: Secondary | ICD-10-CM | POA: Diagnosis not present

## 2023-02-25 DIAGNOSIS — E559 Vitamin D deficiency, unspecified: Secondary | ICD-10-CM | POA: Diagnosis not present

## 2023-02-25 DIAGNOSIS — Z79899 Other long term (current) drug therapy: Secondary | ICD-10-CM | POA: Diagnosis not present

## 2023-02-28 DIAGNOSIS — E2839 Other primary ovarian failure: Secondary | ICD-10-CM | POA: Diagnosis not present

## 2023-03-19 DIAGNOSIS — N6321 Unspecified lump in the left breast, upper outer quadrant: Secondary | ICD-10-CM | POA: Diagnosis not present

## 2023-03-19 DIAGNOSIS — R92322 Mammographic fibroglandular density, left breast: Secondary | ICD-10-CM | POA: Diagnosis not present

## 2023-03-19 DIAGNOSIS — R928 Other abnormal and inconclusive findings on diagnostic imaging of breast: Secondary | ICD-10-CM | POA: Diagnosis not present

## 2023-03-31 DIAGNOSIS — R413 Other amnesia: Secondary | ICD-10-CM | POA: Diagnosis not present

## 2023-03-31 DIAGNOSIS — I10 Essential (primary) hypertension: Secondary | ICD-10-CM | POA: Diagnosis not present

## 2023-03-31 DIAGNOSIS — I152 Hypertension secondary to endocrine disorders: Secondary | ICD-10-CM | POA: Diagnosis not present

## 2023-03-31 DIAGNOSIS — Z299 Encounter for prophylactic measures, unspecified: Secondary | ICD-10-CM | POA: Diagnosis not present

## 2023-03-31 DIAGNOSIS — E1159 Type 2 diabetes mellitus with other circulatory complications: Secondary | ICD-10-CM | POA: Diagnosis not present

## 2023-04-09 DIAGNOSIS — Z171 Estrogen receptor negative status [ER-]: Secondary | ICD-10-CM | POA: Diagnosis not present

## 2023-04-09 DIAGNOSIS — N6321 Unspecified lump in the left breast, upper outer quadrant: Secondary | ICD-10-CM | POA: Diagnosis not present

## 2023-04-09 DIAGNOSIS — C50412 Malignant neoplasm of upper-outer quadrant of left female breast: Secondary | ICD-10-CM | POA: Diagnosis not present

## 2023-04-15 DIAGNOSIS — I10 Essential (primary) hypertension: Secondary | ICD-10-CM | POA: Diagnosis not present

## 2023-04-15 DIAGNOSIS — Z299 Encounter for prophylactic measures, unspecified: Secondary | ICD-10-CM | POA: Diagnosis not present

## 2023-04-15 DIAGNOSIS — R5383 Other fatigue: Secondary | ICD-10-CM | POA: Diagnosis not present

## 2023-04-15 DIAGNOSIS — C50919 Malignant neoplasm of unspecified site of unspecified female breast: Secondary | ICD-10-CM | POA: Diagnosis not present

## 2023-04-21 DIAGNOSIS — C50412 Malignant neoplasm of upper-outer quadrant of left female breast: Secondary | ICD-10-CM | POA: Diagnosis not present

## 2023-04-29 DIAGNOSIS — C50412 Malignant neoplasm of upper-outer quadrant of left female breast: Secondary | ICD-10-CM | POA: Diagnosis not present

## 2023-04-30 DIAGNOSIS — C50912 Malignant neoplasm of unspecified site of left female breast: Secondary | ICD-10-CM | POA: Diagnosis not present

## 2023-04-30 DIAGNOSIS — C50412 Malignant neoplasm of upper-outer quadrant of left female breast: Secondary | ICD-10-CM | POA: Diagnosis not present

## 2023-04-30 DIAGNOSIS — C50012 Malignant neoplasm of nipple and areola, left female breast: Secondary | ICD-10-CM | POA: Diagnosis not present

## 2023-04-30 DIAGNOSIS — E119 Type 2 diabetes mellitus without complications: Secondary | ICD-10-CM | POA: Diagnosis not present

## 2023-04-30 DIAGNOSIS — C50919 Malignant neoplasm of unspecified site of unspecified female breast: Secondary | ICD-10-CM | POA: Diagnosis not present

## 2023-04-30 DIAGNOSIS — D0512 Intraductal carcinoma in situ of left breast: Secondary | ICD-10-CM | POA: Diagnosis not present

## 2023-04-30 DIAGNOSIS — D0510 Intraductal carcinoma in situ of unspecified breast: Secondary | ICD-10-CM | POA: Diagnosis not present

## 2023-04-30 DIAGNOSIS — M797 Fibromyalgia: Secondary | ICD-10-CM | POA: Diagnosis not present

## 2023-05-11 DIAGNOSIS — E119 Type 2 diabetes mellitus without complications: Secondary | ICD-10-CM | POA: Diagnosis not present

## 2023-05-11 DIAGNOSIS — M5489 Other dorsalgia: Secondary | ICD-10-CM | POA: Diagnosis not present

## 2023-05-11 DIAGNOSIS — N6320 Unspecified lump in the left breast, unspecified quadrant: Secondary | ICD-10-CM | POA: Diagnosis not present

## 2023-05-11 DIAGNOSIS — E7800 Pure hypercholesterolemia, unspecified: Secondary | ICD-10-CM | POA: Diagnosis not present

## 2023-05-11 DIAGNOSIS — M797 Fibromyalgia: Secondary | ICD-10-CM | POA: Diagnosis not present

## 2023-05-11 DIAGNOSIS — M25511 Pain in right shoulder: Secondary | ICD-10-CM | POA: Diagnosis not present

## 2023-05-11 DIAGNOSIS — N6489 Other specified disorders of breast: Secondary | ICD-10-CM | POA: Diagnosis not present

## 2023-05-11 DIAGNOSIS — N644 Mastodynia: Secondary | ICD-10-CM | POA: Diagnosis not present

## 2023-05-11 DIAGNOSIS — M25512 Pain in left shoulder: Secondary | ICD-10-CM | POA: Diagnosis not present

## 2023-05-11 DIAGNOSIS — F32A Depression, unspecified: Secondary | ICD-10-CM | POA: Diagnosis not present

## 2023-05-11 DIAGNOSIS — F419 Anxiety disorder, unspecified: Secondary | ICD-10-CM | POA: Diagnosis not present

## 2023-05-13 DIAGNOSIS — Z171 Estrogen receptor negative status [ER-]: Secondary | ICD-10-CM | POA: Diagnosis not present

## 2023-05-13 DIAGNOSIS — Z5181 Encounter for therapeutic drug level monitoring: Secondary | ICD-10-CM | POA: Diagnosis not present

## 2023-05-13 DIAGNOSIS — C50412 Malignant neoplasm of upper-outer quadrant of left female breast: Secondary | ICD-10-CM | POA: Diagnosis not present

## 2023-05-13 DIAGNOSIS — Z79899 Other long term (current) drug therapy: Secondary | ICD-10-CM | POA: Diagnosis not present

## 2023-05-20 DIAGNOSIS — Z299 Encounter for prophylactic measures, unspecified: Secondary | ICD-10-CM | POA: Diagnosis not present

## 2023-05-20 DIAGNOSIS — E1129 Type 2 diabetes mellitus with other diabetic kidney complication: Secondary | ICD-10-CM | POA: Diagnosis not present

## 2023-05-20 DIAGNOSIS — E1165 Type 2 diabetes mellitus with hyperglycemia: Secondary | ICD-10-CM | POA: Diagnosis not present

## 2023-05-20 DIAGNOSIS — J069 Acute upper respiratory infection, unspecified: Secondary | ICD-10-CM | POA: Diagnosis not present

## 2023-05-20 DIAGNOSIS — E1140 Type 2 diabetes mellitus with diabetic neuropathy, unspecified: Secondary | ICD-10-CM | POA: Diagnosis not present

## 2023-05-22 DIAGNOSIS — C50412 Malignant neoplasm of upper-outer quadrant of left female breast: Secondary | ICD-10-CM | POA: Diagnosis not present

## 2023-05-22 DIAGNOSIS — Z171 Estrogen receptor negative status [ER-]: Secondary | ICD-10-CM | POA: Diagnosis not present

## 2023-05-28 DIAGNOSIS — N6489 Other specified disorders of breast: Secondary | ICD-10-CM | POA: Diagnosis not present

## 2023-05-28 DIAGNOSIS — Z171 Estrogen receptor negative status [ER-]: Secondary | ICD-10-CM | POA: Diagnosis not present

## 2023-05-28 DIAGNOSIS — C50412 Malignant neoplasm of upper-outer quadrant of left female breast: Secondary | ICD-10-CM | POA: Diagnosis not present

## 2023-05-30 DIAGNOSIS — I700 Atherosclerosis of aorta: Secondary | ICD-10-CM | POA: Diagnosis not present

## 2023-05-30 DIAGNOSIS — Z171 Estrogen receptor negative status [ER-]: Secondary | ICD-10-CM | POA: Diagnosis not present

## 2023-05-30 DIAGNOSIS — C50412 Malignant neoplasm of upper-outer quadrant of left female breast: Secondary | ICD-10-CM | POA: Diagnosis not present

## 2023-05-30 DIAGNOSIS — C50912 Malignant neoplasm of unspecified site of left female breast: Secondary | ICD-10-CM | POA: Diagnosis not present

## 2023-05-30 DIAGNOSIS — N200 Calculus of kidney: Secondary | ICD-10-CM | POA: Diagnosis not present

## 2023-05-30 DIAGNOSIS — E278 Other specified disorders of adrenal gland: Secondary | ICD-10-CM | POA: Diagnosis not present

## 2023-05-30 DIAGNOSIS — R935 Abnormal findings on diagnostic imaging of other abdominal regions, including retroperitoneum: Secondary | ICD-10-CM | POA: Diagnosis not present

## 2023-06-03 DIAGNOSIS — Z79899 Other long term (current) drug therapy: Secondary | ICD-10-CM | POA: Diagnosis not present

## 2023-06-03 DIAGNOSIS — Z452 Encounter for adjustment and management of vascular access device: Secondary | ICD-10-CM | POA: Diagnosis not present

## 2023-06-03 DIAGNOSIS — Z881 Allergy status to other antibiotic agents status: Secondary | ICD-10-CM | POA: Diagnosis not present

## 2023-06-03 DIAGNOSIS — E119 Type 2 diabetes mellitus without complications: Secondary | ICD-10-CM | POA: Diagnosis not present

## 2023-06-03 DIAGNOSIS — Z171 Estrogen receptor negative status [ER-]: Secondary | ICD-10-CM | POA: Diagnosis not present

## 2023-06-03 DIAGNOSIS — M797 Fibromyalgia: Secondary | ICD-10-CM | POA: Diagnosis not present

## 2023-06-03 DIAGNOSIS — Z7984 Long term (current) use of oral hypoglycemic drugs: Secondary | ICD-10-CM | POA: Diagnosis not present

## 2023-06-03 DIAGNOSIS — E7800 Pure hypercholesterolemia, unspecified: Secondary | ICD-10-CM | POA: Diagnosis not present

## 2023-06-03 DIAGNOSIS — C50912 Malignant neoplasm of unspecified site of left female breast: Secondary | ICD-10-CM | POA: Diagnosis not present

## 2023-06-03 DIAGNOSIS — C50412 Malignant neoplasm of upper-outer quadrant of left female breast: Secondary | ICD-10-CM | POA: Diagnosis not present

## 2023-06-06 DIAGNOSIS — Z79899 Other long term (current) drug therapy: Secondary | ICD-10-CM | POA: Diagnosis not present

## 2023-06-06 DIAGNOSIS — I082 Rheumatic disorders of both aortic and tricuspid valves: Secondary | ICD-10-CM | POA: Diagnosis not present

## 2023-06-06 DIAGNOSIS — Z171 Estrogen receptor negative status [ER-]: Secondary | ICD-10-CM | POA: Diagnosis not present

## 2023-06-06 DIAGNOSIS — C50412 Malignant neoplasm of upper-outer quadrant of left female breast: Secondary | ICD-10-CM | POA: Diagnosis not present

## 2023-06-06 DIAGNOSIS — Z5181 Encounter for therapeutic drug level monitoring: Secondary | ICD-10-CM | POA: Diagnosis not present

## 2023-06-10 DIAGNOSIS — Z1159 Encounter for screening for other viral diseases: Secondary | ICD-10-CM | POA: Diagnosis not present

## 2023-06-10 DIAGNOSIS — Z79899 Other long term (current) drug therapy: Secondary | ICD-10-CM | POA: Diagnosis not present

## 2023-06-10 DIAGNOSIS — N6489 Other specified disorders of breast: Secondary | ICD-10-CM | POA: Diagnosis not present

## 2023-06-10 DIAGNOSIS — C50412 Malignant neoplasm of upper-outer quadrant of left female breast: Secondary | ICD-10-CM | POA: Diagnosis not present

## 2023-06-10 DIAGNOSIS — Z171 Estrogen receptor negative status [ER-]: Secondary | ICD-10-CM | POA: Diagnosis not present

## 2023-06-10 DIAGNOSIS — Z5181 Encounter for therapeutic drug level monitoring: Secondary | ICD-10-CM | POA: Diagnosis not present

## 2023-06-11 DIAGNOSIS — Z171 Estrogen receptor negative status [ER-]: Secondary | ICD-10-CM | POA: Diagnosis not present

## 2023-06-11 DIAGNOSIS — Z5111 Encounter for antineoplastic chemotherapy: Secondary | ICD-10-CM | POA: Diagnosis not present

## 2023-06-11 DIAGNOSIS — C50412 Malignant neoplasm of upper-outer quadrant of left female breast: Secondary | ICD-10-CM | POA: Diagnosis not present

## 2023-06-12 DIAGNOSIS — Z171 Estrogen receptor negative status [ER-]: Secondary | ICD-10-CM | POA: Diagnosis not present

## 2023-06-12 DIAGNOSIS — Z7689 Persons encountering health services in other specified circumstances: Secondary | ICD-10-CM | POA: Diagnosis not present

## 2023-06-12 DIAGNOSIS — C50412 Malignant neoplasm of upper-outer quadrant of left female breast: Secondary | ICD-10-CM | POA: Diagnosis not present

## 2023-06-24 DIAGNOSIS — Z23 Encounter for immunization: Secondary | ICD-10-CM | POA: Diagnosis not present

## 2023-06-24 DIAGNOSIS — C50412 Malignant neoplasm of upper-outer quadrant of left female breast: Secondary | ICD-10-CM | POA: Diagnosis not present

## 2023-06-24 DIAGNOSIS — Z171 Estrogen receptor negative status [ER-]: Secondary | ICD-10-CM | POA: Diagnosis not present

## 2023-06-26 DIAGNOSIS — Z171 Estrogen receptor negative status [ER-]: Secondary | ICD-10-CM | POA: Diagnosis not present

## 2023-06-26 DIAGNOSIS — C50412 Malignant neoplasm of upper-outer quadrant of left female breast: Secondary | ICD-10-CM | POA: Diagnosis not present

## 2023-06-26 DIAGNOSIS — Z5111 Encounter for antineoplastic chemotherapy: Secondary | ICD-10-CM | POA: Diagnosis not present

## 2023-06-27 DIAGNOSIS — Z171 Estrogen receptor negative status [ER-]: Secondary | ICD-10-CM | POA: Diagnosis not present

## 2023-06-27 DIAGNOSIS — C50412 Malignant neoplasm of upper-outer quadrant of left female breast: Secondary | ICD-10-CM | POA: Diagnosis not present

## 2023-07-08 DIAGNOSIS — C50412 Malignant neoplasm of upper-outer quadrant of left female breast: Secondary | ICD-10-CM | POA: Diagnosis not present

## 2023-07-08 DIAGNOSIS — Z171 Estrogen receptor negative status [ER-]: Secondary | ICD-10-CM | POA: Diagnosis not present

## 2023-07-10 DIAGNOSIS — Z95828 Presence of other vascular implants and grafts: Secondary | ICD-10-CM | POA: Diagnosis not present

## 2023-07-10 DIAGNOSIS — Z171 Estrogen receptor negative status [ER-]: Secondary | ICD-10-CM | POA: Diagnosis not present

## 2023-07-10 DIAGNOSIS — C50412 Malignant neoplasm of upper-outer quadrant of left female breast: Secondary | ICD-10-CM | POA: Diagnosis not present

## 2023-07-10 DIAGNOSIS — Z5111 Encounter for antineoplastic chemotherapy: Secondary | ICD-10-CM | POA: Diagnosis not present

## 2023-07-11 DIAGNOSIS — C50412 Malignant neoplasm of upper-outer quadrant of left female breast: Secondary | ICD-10-CM | POA: Diagnosis not present

## 2023-07-11 DIAGNOSIS — Z7689 Persons encountering health services in other specified circumstances: Secondary | ICD-10-CM | POA: Diagnosis not present

## 2023-07-11 DIAGNOSIS — Z171 Estrogen receptor negative status [ER-]: Secondary | ICD-10-CM | POA: Diagnosis not present

## 2023-07-23 DIAGNOSIS — Z171 Estrogen receptor negative status [ER-]: Secondary | ICD-10-CM | POA: Diagnosis not present

## 2023-07-23 DIAGNOSIS — C50412 Malignant neoplasm of upper-outer quadrant of left female breast: Secondary | ICD-10-CM | POA: Diagnosis not present

## 2023-07-24 DIAGNOSIS — C50412 Malignant neoplasm of upper-outer quadrant of left female breast: Secondary | ICD-10-CM | POA: Diagnosis not present

## 2023-07-24 DIAGNOSIS — Z5111 Encounter for antineoplastic chemotherapy: Secondary | ICD-10-CM | POA: Diagnosis not present

## 2023-07-24 DIAGNOSIS — Z171 Estrogen receptor negative status [ER-]: Secondary | ICD-10-CM | POA: Diagnosis not present

## 2023-07-25 DIAGNOSIS — C50412 Malignant neoplasm of upper-outer quadrant of left female breast: Secondary | ICD-10-CM | POA: Diagnosis not present

## 2023-07-25 DIAGNOSIS — Z171 Estrogen receptor negative status [ER-]: Secondary | ICD-10-CM | POA: Diagnosis not present

## 2023-08-01 DIAGNOSIS — E119 Type 2 diabetes mellitus without complications: Secondary | ICD-10-CM | POA: Diagnosis not present

## 2023-08-01 DIAGNOSIS — M069 Rheumatoid arthritis, unspecified: Secondary | ICD-10-CM | POA: Diagnosis not present

## 2023-08-01 DIAGNOSIS — C50912 Malignant neoplasm of unspecified site of left female breast: Secondary | ICD-10-CM | POA: Diagnosis not present

## 2023-08-01 DIAGNOSIS — D709 Neutropenia, unspecified: Secondary | ICD-10-CM | POA: Diagnosis not present

## 2023-08-01 DIAGNOSIS — F411 Generalized anxiety disorder: Secondary | ICD-10-CM | POA: Diagnosis not present

## 2023-08-01 DIAGNOSIS — I2699 Other pulmonary embolism without acute cor pulmonale: Secondary | ICD-10-CM | POA: Diagnosis not present

## 2023-08-01 DIAGNOSIS — D61818 Other pancytopenia: Secondary | ICD-10-CM | POA: Diagnosis not present

## 2023-08-01 DIAGNOSIS — F32A Depression, unspecified: Secondary | ICD-10-CM | POA: Diagnosis not present

## 2023-08-01 DIAGNOSIS — R0602 Shortness of breath: Secondary | ICD-10-CM | POA: Diagnosis not present

## 2023-08-01 DIAGNOSIS — Z79899 Other long term (current) drug therapy: Secondary | ICD-10-CM | POA: Diagnosis not present

## 2023-08-01 DIAGNOSIS — E876 Hypokalemia: Secondary | ICD-10-CM | POA: Diagnosis not present

## 2023-08-01 DIAGNOSIS — E7800 Pure hypercholesterolemia, unspecified: Secondary | ICD-10-CM | POA: Diagnosis not present

## 2023-08-05 DIAGNOSIS — B370 Candidal stomatitis: Secondary | ICD-10-CM | POA: Diagnosis not present

## 2023-08-05 DIAGNOSIS — Z79899 Other long term (current) drug therapy: Secondary | ICD-10-CM | POA: Diagnosis not present

## 2023-08-05 DIAGNOSIS — N6489 Other specified disorders of breast: Secondary | ICD-10-CM | POA: Diagnosis not present

## 2023-08-05 DIAGNOSIS — Z171 Estrogen receptor negative status [ER-]: Secondary | ICD-10-CM | POA: Diagnosis not present

## 2023-08-05 DIAGNOSIS — Z5181 Encounter for therapeutic drug level monitoring: Secondary | ICD-10-CM | POA: Diagnosis not present

## 2023-08-05 DIAGNOSIS — C50412 Malignant neoplasm of upper-outer quadrant of left female breast: Secondary | ICD-10-CM | POA: Diagnosis not present

## 2023-08-07 DIAGNOSIS — Z5111 Encounter for antineoplastic chemotherapy: Secondary | ICD-10-CM | POA: Diagnosis not present

## 2023-08-07 DIAGNOSIS — Z171 Estrogen receptor negative status [ER-]: Secondary | ICD-10-CM | POA: Diagnosis not present

## 2023-08-07 DIAGNOSIS — C50412 Malignant neoplasm of upper-outer quadrant of left female breast: Secondary | ICD-10-CM | POA: Diagnosis not present

## 2023-08-12 DIAGNOSIS — Z171 Estrogen receptor negative status [ER-]: Secondary | ICD-10-CM | POA: Diagnosis not present

## 2023-08-12 DIAGNOSIS — C50412 Malignant neoplasm of upper-outer quadrant of left female breast: Secondary | ICD-10-CM | POA: Diagnosis not present

## 2023-08-20 DIAGNOSIS — C50412 Malignant neoplasm of upper-outer quadrant of left female breast: Secondary | ICD-10-CM | POA: Diagnosis not present

## 2023-08-20 DIAGNOSIS — Z171 Estrogen receptor negative status [ER-]: Secondary | ICD-10-CM | POA: Diagnosis not present

## 2023-08-26 DIAGNOSIS — D709 Neutropenia, unspecified: Secondary | ICD-10-CM | POA: Diagnosis not present

## 2023-08-26 DIAGNOSIS — C50412 Malignant neoplasm of upper-outer quadrant of left female breast: Secondary | ICD-10-CM | POA: Diagnosis not present

## 2023-08-26 DIAGNOSIS — Z171 Estrogen receptor negative status [ER-]: Secondary | ICD-10-CM | POA: Diagnosis not present

## 2023-09-02 DIAGNOSIS — C50412 Malignant neoplasm of upper-outer quadrant of left female breast: Secondary | ICD-10-CM | POA: Diagnosis not present

## 2023-09-02 DIAGNOSIS — Z171 Estrogen receptor negative status [ER-]: Secondary | ICD-10-CM | POA: Diagnosis not present

## 2023-09-04 DIAGNOSIS — C50412 Malignant neoplasm of upper-outer quadrant of left female breast: Secondary | ICD-10-CM | POA: Diagnosis not present

## 2023-09-04 DIAGNOSIS — Z171 Estrogen receptor negative status [ER-]: Secondary | ICD-10-CM | POA: Diagnosis not present

## 2023-09-11 DIAGNOSIS — Z171 Estrogen receptor negative status [ER-]: Secondary | ICD-10-CM | POA: Diagnosis not present

## 2023-09-11 DIAGNOSIS — C50412 Malignant neoplasm of upper-outer quadrant of left female breast: Secondary | ICD-10-CM | POA: Diagnosis not present

## 2023-09-12 DIAGNOSIS — M069 Rheumatoid arthritis, unspecified: Secondary | ICD-10-CM | POA: Diagnosis not present

## 2023-09-12 DIAGNOSIS — C50412 Malignant neoplasm of upper-outer quadrant of left female breast: Secondary | ICD-10-CM | POA: Diagnosis not present

## 2023-09-12 DIAGNOSIS — Z171 Estrogen receptor negative status [ER-]: Secondary | ICD-10-CM | POA: Diagnosis not present

## 2023-09-24 DIAGNOSIS — Z171 Estrogen receptor negative status [ER-]: Secondary | ICD-10-CM | POA: Diagnosis not present

## 2023-09-24 DIAGNOSIS — C50412 Malignant neoplasm of upper-outer quadrant of left female breast: Secondary | ICD-10-CM | POA: Diagnosis not present

## 2023-10-01 DIAGNOSIS — Z171 Estrogen receptor negative status [ER-]: Secondary | ICD-10-CM | POA: Diagnosis not present

## 2023-10-01 DIAGNOSIS — C50412 Malignant neoplasm of upper-outer quadrant of left female breast: Secondary | ICD-10-CM | POA: Diagnosis not present

## 2023-10-02 DIAGNOSIS — Z5111 Encounter for antineoplastic chemotherapy: Secondary | ICD-10-CM | POA: Diagnosis not present

## 2023-10-02 DIAGNOSIS — Z95828 Presence of other vascular implants and grafts: Secondary | ICD-10-CM | POA: Diagnosis not present

## 2023-10-02 DIAGNOSIS — C50412 Malignant neoplasm of upper-outer quadrant of left female breast: Secondary | ICD-10-CM | POA: Diagnosis not present

## 2023-10-02 DIAGNOSIS — Z171 Estrogen receptor negative status [ER-]: Secondary | ICD-10-CM | POA: Diagnosis not present

## 2023-10-03 DIAGNOSIS — C50412 Malignant neoplasm of upper-outer quadrant of left female breast: Secondary | ICD-10-CM | POA: Diagnosis not present

## 2023-10-03 DIAGNOSIS — Z171 Estrogen receptor negative status [ER-]: Secondary | ICD-10-CM | POA: Diagnosis not present

## 2023-10-13 DIAGNOSIS — R0981 Nasal congestion: Secondary | ICD-10-CM | POA: Diagnosis not present

## 2023-10-13 DIAGNOSIS — C50919 Malignant neoplasm of unspecified site of unspecified female breast: Secondary | ICD-10-CM | POA: Diagnosis not present

## 2023-10-13 DIAGNOSIS — Z299 Encounter for prophylactic measures, unspecified: Secondary | ICD-10-CM | POA: Diagnosis not present

## 2023-10-13 DIAGNOSIS — R059 Cough, unspecified: Secondary | ICD-10-CM | POA: Diagnosis not present

## 2023-10-13 DIAGNOSIS — J069 Acute upper respiratory infection, unspecified: Secondary | ICD-10-CM | POA: Diagnosis not present

## 2023-10-21 DIAGNOSIS — Z171 Estrogen receptor negative status [ER-]: Secondary | ICD-10-CM | POA: Diagnosis not present

## 2023-10-21 DIAGNOSIS — C50412 Malignant neoplasm of upper-outer quadrant of left female breast: Secondary | ICD-10-CM | POA: Diagnosis not present

## 2023-10-23 DIAGNOSIS — Z171 Estrogen receptor negative status [ER-]: Secondary | ICD-10-CM | POA: Diagnosis not present

## 2023-10-23 DIAGNOSIS — Z5111 Encounter for antineoplastic chemotherapy: Secondary | ICD-10-CM | POA: Diagnosis not present

## 2023-10-23 DIAGNOSIS — C50412 Malignant neoplasm of upper-outer quadrant of left female breast: Secondary | ICD-10-CM | POA: Diagnosis not present

## 2023-10-24 DIAGNOSIS — C50412 Malignant neoplasm of upper-outer quadrant of left female breast: Secondary | ICD-10-CM | POA: Diagnosis not present

## 2023-10-24 DIAGNOSIS — Z171 Estrogen receptor negative status [ER-]: Secondary | ICD-10-CM | POA: Diagnosis not present

## 2023-10-27 DIAGNOSIS — C50412 Malignant neoplasm of upper-outer quadrant of left female breast: Secondary | ICD-10-CM | POA: Diagnosis not present

## 2023-10-27 DIAGNOSIS — Z171 Estrogen receptor negative status [ER-]: Secondary | ICD-10-CM | POA: Diagnosis not present

## 2023-10-30 DIAGNOSIS — C50412 Malignant neoplasm of upper-outer quadrant of left female breast: Secondary | ICD-10-CM | POA: Diagnosis not present

## 2023-10-30 DIAGNOSIS — Z171 Estrogen receptor negative status [ER-]: Secondary | ICD-10-CM | POA: Diagnosis not present

## 2023-11-11 DIAGNOSIS — Z79899 Other long term (current) drug therapy: Secondary | ICD-10-CM | POA: Diagnosis not present

## 2023-11-11 DIAGNOSIS — Z9221 Personal history of antineoplastic chemotherapy: Secondary | ICD-10-CM | POA: Diagnosis not present

## 2023-11-11 DIAGNOSIS — Z7984 Long term (current) use of oral hypoglycemic drugs: Secondary | ICD-10-CM | POA: Diagnosis not present

## 2023-11-11 DIAGNOSIS — Z171 Estrogen receptor negative status [ER-]: Secondary | ICD-10-CM | POA: Diagnosis not present

## 2023-11-11 DIAGNOSIS — C50412 Malignant neoplasm of upper-outer quadrant of left female breast: Secondary | ICD-10-CM | POA: Diagnosis not present

## 2023-11-19 DIAGNOSIS — C50412 Malignant neoplasm of upper-outer quadrant of left female breast: Secondary | ICD-10-CM | POA: Diagnosis not present

## 2023-11-19 DIAGNOSIS — Z95828 Presence of other vascular implants and grafts: Secondary | ICD-10-CM | POA: Diagnosis not present

## 2023-11-19 DIAGNOSIS — Z171 Estrogen receptor negative status [ER-]: Secondary | ICD-10-CM | POA: Diagnosis not present

## 2023-12-01 DIAGNOSIS — C50412 Malignant neoplasm of upper-outer quadrant of left female breast: Secondary | ICD-10-CM | POA: Diagnosis not present

## 2023-12-01 DIAGNOSIS — Z95828 Presence of other vascular implants and grafts: Secondary | ICD-10-CM | POA: Diagnosis not present

## 2023-12-01 DIAGNOSIS — Z171 Estrogen receptor negative status [ER-]: Secondary | ICD-10-CM | POA: Diagnosis not present

## 2023-12-02 DIAGNOSIS — Z95828 Presence of other vascular implants and grafts: Secondary | ICD-10-CM | POA: Diagnosis not present

## 2023-12-02 DIAGNOSIS — Z171 Estrogen receptor negative status [ER-]: Secondary | ICD-10-CM | POA: Diagnosis not present

## 2023-12-02 DIAGNOSIS — C50412 Malignant neoplasm of upper-outer quadrant of left female breast: Secondary | ICD-10-CM | POA: Diagnosis not present

## 2023-12-03 DIAGNOSIS — Z95828 Presence of other vascular implants and grafts: Secondary | ICD-10-CM | POA: Diagnosis not present

## 2023-12-03 DIAGNOSIS — Z171 Estrogen receptor negative status [ER-]: Secondary | ICD-10-CM | POA: Diagnosis not present

## 2023-12-03 DIAGNOSIS — C50412 Malignant neoplasm of upper-outer quadrant of left female breast: Secondary | ICD-10-CM | POA: Diagnosis not present

## 2023-12-04 DIAGNOSIS — Z95828 Presence of other vascular implants and grafts: Secondary | ICD-10-CM | POA: Diagnosis not present

## 2023-12-04 DIAGNOSIS — Z171 Estrogen receptor negative status [ER-]: Secondary | ICD-10-CM | POA: Diagnosis not present

## 2023-12-04 DIAGNOSIS — C50412 Malignant neoplasm of upper-outer quadrant of left female breast: Secondary | ICD-10-CM | POA: Diagnosis not present

## 2023-12-05 DIAGNOSIS — Z95828 Presence of other vascular implants and grafts: Secondary | ICD-10-CM | POA: Diagnosis not present

## 2023-12-05 DIAGNOSIS — C50412 Malignant neoplasm of upper-outer quadrant of left female breast: Secondary | ICD-10-CM | POA: Diagnosis not present

## 2023-12-05 DIAGNOSIS — Z171 Estrogen receptor negative status [ER-]: Secondary | ICD-10-CM | POA: Diagnosis not present

## 2023-12-08 DIAGNOSIS — Z95828 Presence of other vascular implants and grafts: Secondary | ICD-10-CM | POA: Diagnosis not present

## 2023-12-08 DIAGNOSIS — Z171 Estrogen receptor negative status [ER-]: Secondary | ICD-10-CM | POA: Diagnosis not present

## 2023-12-08 DIAGNOSIS — C50412 Malignant neoplasm of upper-outer quadrant of left female breast: Secondary | ICD-10-CM | POA: Diagnosis not present

## 2023-12-09 DIAGNOSIS — Z95828 Presence of other vascular implants and grafts: Secondary | ICD-10-CM | POA: Diagnosis not present

## 2023-12-09 DIAGNOSIS — Z171 Estrogen receptor negative status [ER-]: Secondary | ICD-10-CM | POA: Diagnosis not present

## 2023-12-09 DIAGNOSIS — C50412 Malignant neoplasm of upper-outer quadrant of left female breast: Secondary | ICD-10-CM | POA: Diagnosis not present

## 2023-12-10 DIAGNOSIS — Z95828 Presence of other vascular implants and grafts: Secondary | ICD-10-CM | POA: Diagnosis not present

## 2023-12-10 DIAGNOSIS — Z171 Estrogen receptor negative status [ER-]: Secondary | ICD-10-CM | POA: Diagnosis not present

## 2023-12-10 DIAGNOSIS — C50412 Malignant neoplasm of upper-outer quadrant of left female breast: Secondary | ICD-10-CM | POA: Diagnosis not present

## 2023-12-11 DIAGNOSIS — C50412 Malignant neoplasm of upper-outer quadrant of left female breast: Secondary | ICD-10-CM | POA: Diagnosis not present

## 2023-12-11 DIAGNOSIS — Z95828 Presence of other vascular implants and grafts: Secondary | ICD-10-CM | POA: Diagnosis not present

## 2023-12-11 DIAGNOSIS — Z171 Estrogen receptor negative status [ER-]: Secondary | ICD-10-CM | POA: Diagnosis not present

## 2023-12-12 DIAGNOSIS — Z95828 Presence of other vascular implants and grafts: Secondary | ICD-10-CM | POA: Diagnosis not present

## 2023-12-12 DIAGNOSIS — C50412 Malignant neoplasm of upper-outer quadrant of left female breast: Secondary | ICD-10-CM | POA: Diagnosis not present

## 2023-12-12 DIAGNOSIS — Z171 Estrogen receptor negative status [ER-]: Secondary | ICD-10-CM | POA: Diagnosis not present

## 2023-12-15 DIAGNOSIS — Z171 Estrogen receptor negative status [ER-]: Secondary | ICD-10-CM | POA: Diagnosis not present

## 2023-12-15 DIAGNOSIS — C50412 Malignant neoplasm of upper-outer quadrant of left female breast: Secondary | ICD-10-CM | POA: Diagnosis not present

## 2023-12-15 DIAGNOSIS — Z95828 Presence of other vascular implants and grafts: Secondary | ICD-10-CM | POA: Diagnosis not present

## 2023-12-16 DIAGNOSIS — C50412 Malignant neoplasm of upper-outer quadrant of left female breast: Secondary | ICD-10-CM | POA: Diagnosis not present

## 2023-12-16 DIAGNOSIS — Z171 Estrogen receptor negative status [ER-]: Secondary | ICD-10-CM | POA: Diagnosis not present

## 2023-12-17 DIAGNOSIS — Z171 Estrogen receptor negative status [ER-]: Secondary | ICD-10-CM | POA: Diagnosis not present

## 2023-12-17 DIAGNOSIS — C50412 Malignant neoplasm of upper-outer quadrant of left female breast: Secondary | ICD-10-CM | POA: Diagnosis not present

## 2023-12-18 DIAGNOSIS — Z171 Estrogen receptor negative status [ER-]: Secondary | ICD-10-CM | POA: Diagnosis not present

## 2023-12-18 DIAGNOSIS — C50412 Malignant neoplasm of upper-outer quadrant of left female breast: Secondary | ICD-10-CM | POA: Diagnosis not present

## 2023-12-19 DIAGNOSIS — C50412 Malignant neoplasm of upper-outer quadrant of left female breast: Secondary | ICD-10-CM | POA: Diagnosis not present

## 2023-12-19 DIAGNOSIS — Z171 Estrogen receptor negative status [ER-]: Secondary | ICD-10-CM | POA: Diagnosis not present

## 2023-12-22 DIAGNOSIS — Z171 Estrogen receptor negative status [ER-]: Secondary | ICD-10-CM | POA: Diagnosis not present

## 2023-12-22 DIAGNOSIS — C50412 Malignant neoplasm of upper-outer quadrant of left female breast: Secondary | ICD-10-CM | POA: Diagnosis not present

## 2023-12-23 DIAGNOSIS — C50412 Malignant neoplasm of upper-outer quadrant of left female breast: Secondary | ICD-10-CM | POA: Diagnosis not present

## 2023-12-23 DIAGNOSIS — Z171 Estrogen receptor negative status [ER-]: Secondary | ICD-10-CM | POA: Diagnosis not present

## 2023-12-24 DIAGNOSIS — Z171 Estrogen receptor negative status [ER-]: Secondary | ICD-10-CM | POA: Diagnosis not present

## 2023-12-24 DIAGNOSIS — C50412 Malignant neoplasm of upper-outer quadrant of left female breast: Secondary | ICD-10-CM | POA: Diagnosis not present

## 2023-12-25 DIAGNOSIS — Z171 Estrogen receptor negative status [ER-]: Secondary | ICD-10-CM | POA: Diagnosis not present

## 2023-12-25 DIAGNOSIS — C50412 Malignant neoplasm of upper-outer quadrant of left female breast: Secondary | ICD-10-CM | POA: Diagnosis not present

## 2023-12-26 DIAGNOSIS — Z171 Estrogen receptor negative status [ER-]: Secondary | ICD-10-CM | POA: Diagnosis not present

## 2023-12-26 DIAGNOSIS — C50412 Malignant neoplasm of upper-outer quadrant of left female breast: Secondary | ICD-10-CM | POA: Diagnosis not present

## 2023-12-29 DIAGNOSIS — Z171 Estrogen receptor negative status [ER-]: Secondary | ICD-10-CM | POA: Diagnosis not present

## 2023-12-29 DIAGNOSIS — C50412 Malignant neoplasm of upper-outer quadrant of left female breast: Secondary | ICD-10-CM | POA: Diagnosis not present

## 2023-12-30 DIAGNOSIS — C50412 Malignant neoplasm of upper-outer quadrant of left female breast: Secondary | ICD-10-CM | POA: Diagnosis not present

## 2023-12-30 DIAGNOSIS — Z171 Estrogen receptor negative status [ER-]: Secondary | ICD-10-CM | POA: Diagnosis not present

## 2024-01-01 DIAGNOSIS — C50412 Malignant neoplasm of upper-outer quadrant of left female breast: Secondary | ICD-10-CM | POA: Diagnosis not present

## 2024-01-01 DIAGNOSIS — Z171 Estrogen receptor negative status [ER-]: Secondary | ICD-10-CM | POA: Diagnosis not present

## 2024-01-08 DIAGNOSIS — C50412 Malignant neoplasm of upper-outer quadrant of left female breast: Secondary | ICD-10-CM | POA: Diagnosis not present

## 2024-01-08 DIAGNOSIS — Z171 Estrogen receptor negative status [ER-]: Secondary | ICD-10-CM | POA: Diagnosis not present

## 2024-01-29 DIAGNOSIS — C50412 Malignant neoplasm of upper-outer quadrant of left female breast: Secondary | ICD-10-CM | POA: Diagnosis not present

## 2024-01-29 DIAGNOSIS — Z171 Estrogen receptor negative status [ER-]: Secondary | ICD-10-CM | POA: Diagnosis not present

## 2024-02-12 DIAGNOSIS — Z95828 Presence of other vascular implants and grafts: Secondary | ICD-10-CM | POA: Diagnosis not present

## 2024-02-12 DIAGNOSIS — Z452 Encounter for adjustment and management of vascular access device: Secondary | ICD-10-CM | POA: Diagnosis not present

## 2024-02-12 DIAGNOSIS — C50412 Malignant neoplasm of upper-outer quadrant of left female breast: Secondary | ICD-10-CM | POA: Diagnosis not present

## 2024-02-12 DIAGNOSIS — Z171 Estrogen receptor negative status [ER-]: Secondary | ICD-10-CM | POA: Diagnosis not present

## 2024-02-23 DIAGNOSIS — Z1339 Encounter for screening examination for other mental health and behavioral disorders: Secondary | ICD-10-CM | POA: Diagnosis not present

## 2024-02-23 DIAGNOSIS — Z1331 Encounter for screening for depression: Secondary | ICD-10-CM | POA: Diagnosis not present

## 2024-02-23 DIAGNOSIS — I10 Essential (primary) hypertension: Secondary | ICD-10-CM | POA: Diagnosis not present

## 2024-02-23 DIAGNOSIS — M797 Fibromyalgia: Secondary | ICD-10-CM | POA: Diagnosis not present

## 2024-02-23 DIAGNOSIS — Z0000 Encounter for general adult medical examination without abnormal findings: Secondary | ICD-10-CM | POA: Diagnosis not present

## 2024-02-23 DIAGNOSIS — Z299 Encounter for prophylactic measures, unspecified: Secondary | ICD-10-CM | POA: Diagnosis not present

## 2024-02-23 DIAGNOSIS — R52 Pain, unspecified: Secondary | ICD-10-CM | POA: Diagnosis not present

## 2024-02-23 DIAGNOSIS — F331 Major depressive disorder, recurrent, moderate: Secondary | ICD-10-CM | POA: Diagnosis not present

## 2024-02-23 DIAGNOSIS — Z7189 Other specified counseling: Secondary | ICD-10-CM | POA: Diagnosis not present

## 2024-03-09 DIAGNOSIS — E1165 Type 2 diabetes mellitus with hyperglycemia: Secondary | ICD-10-CM | POA: Diagnosis not present

## 2024-03-09 DIAGNOSIS — Z299 Encounter for prophylactic measures, unspecified: Secondary | ICD-10-CM | POA: Diagnosis not present

## 2024-03-09 DIAGNOSIS — R000 Tachycardia, unspecified: Secondary | ICD-10-CM | POA: Diagnosis not present

## 2024-03-09 DIAGNOSIS — I10 Essential (primary) hypertension: Secondary | ICD-10-CM | POA: Diagnosis not present

## 2024-03-09 DIAGNOSIS — R079 Chest pain, unspecified: Secondary | ICD-10-CM | POA: Diagnosis not present

## 2024-03-17 DIAGNOSIS — E1169 Type 2 diabetes mellitus with other specified complication: Secondary | ICD-10-CM | POA: Diagnosis not present

## 2024-03-17 DIAGNOSIS — J020 Streptococcal pharyngitis: Secondary | ICD-10-CM | POA: Diagnosis not present

## 2024-03-17 DIAGNOSIS — J069 Acute upper respiratory infection, unspecified: Secondary | ICD-10-CM | POA: Diagnosis not present

## 2024-03-17 DIAGNOSIS — Z299 Encounter for prophylactic measures, unspecified: Secondary | ICD-10-CM | POA: Diagnosis not present

## 2024-03-29 DIAGNOSIS — R000 Tachycardia, unspecified: Secondary | ICD-10-CM | POA: Diagnosis not present

## 2024-03-29 DIAGNOSIS — R350 Frequency of micturition: Secondary | ICD-10-CM | POA: Diagnosis not present

## 2024-03-29 DIAGNOSIS — Z299 Encounter for prophylactic measures, unspecified: Secondary | ICD-10-CM | POA: Diagnosis not present

## 2024-03-29 DIAGNOSIS — N200 Calculus of kidney: Secondary | ICD-10-CM | POA: Diagnosis not present

## 2024-04-01 DIAGNOSIS — C50412 Malignant neoplasm of upper-outer quadrant of left female breast: Secondary | ICD-10-CM | POA: Diagnosis not present

## 2024-04-01 DIAGNOSIS — Z95828 Presence of other vascular implants and grafts: Secondary | ICD-10-CM | POA: Diagnosis not present

## 2024-04-01 DIAGNOSIS — Z171 Estrogen receptor negative status [ER-]: Secondary | ICD-10-CM | POA: Diagnosis not present

## 2024-04-06 DIAGNOSIS — I4719 Other supraventricular tachycardia: Secondary | ICD-10-CM | POA: Diagnosis not present

## 2024-04-08 DIAGNOSIS — C50412 Malignant neoplasm of upper-outer quadrant of left female breast: Secondary | ICD-10-CM | POA: Diagnosis not present

## 2024-04-08 DIAGNOSIS — Z171 Estrogen receptor negative status [ER-]: Secondary | ICD-10-CM | POA: Diagnosis not present

## 2024-04-13 DIAGNOSIS — R000 Tachycardia, unspecified: Secondary | ICD-10-CM | POA: Diagnosis not present

## 2024-04-16 DIAGNOSIS — H5213 Myopia, bilateral: Secondary | ICD-10-CM | POA: Diagnosis not present

## 2024-04-26 DIAGNOSIS — R070 Pain in throat: Secondary | ICD-10-CM | POA: Diagnosis not present

## 2024-04-26 DIAGNOSIS — R0981 Nasal congestion: Secondary | ICD-10-CM | POA: Diagnosis not present

## 2024-04-26 DIAGNOSIS — Z01818 Encounter for other preprocedural examination: Secondary | ICD-10-CM | POA: Diagnosis not present

## 2024-04-26 DIAGNOSIS — H2513 Age-related nuclear cataract, bilateral: Secondary | ICD-10-CM | POA: Diagnosis not present

## 2024-04-26 DIAGNOSIS — Z299 Encounter for prophylactic measures, unspecified: Secondary | ICD-10-CM | POA: Diagnosis not present

## 2024-04-26 DIAGNOSIS — H2511 Age-related nuclear cataract, right eye: Secondary | ICD-10-CM | POA: Diagnosis not present

## 2024-04-26 DIAGNOSIS — J069 Acute upper respiratory infection, unspecified: Secondary | ICD-10-CM | POA: Diagnosis not present

## 2024-04-26 DIAGNOSIS — H353132 Nonexudative age-related macular degeneration, bilateral, intermediate dry stage: Secondary | ICD-10-CM | POA: Diagnosis not present

## 2024-05-06 DIAGNOSIS — C50412 Malignant neoplasm of upper-outer quadrant of left female breast: Secondary | ICD-10-CM | POA: Diagnosis not present

## 2024-05-06 DIAGNOSIS — Z171 Estrogen receptor negative status [ER-]: Secondary | ICD-10-CM | POA: Diagnosis not present

## 2024-05-06 DIAGNOSIS — Z95828 Presence of other vascular implants and grafts: Secondary | ICD-10-CM | POA: Diagnosis not present

## 2024-06-03 DIAGNOSIS — J069 Acute upper respiratory infection, unspecified: Secondary | ICD-10-CM | POA: Diagnosis not present

## 2024-06-03 DIAGNOSIS — R110 Nausea: Secondary | ICD-10-CM | POA: Diagnosis not present

## 2024-06-14 DIAGNOSIS — C50412 Malignant neoplasm of upper-outer quadrant of left female breast: Secondary | ICD-10-CM | POA: Diagnosis not present

## 2024-06-14 DIAGNOSIS — Z171 Estrogen receptor negative status [ER-]: Secondary | ICD-10-CM | POA: Diagnosis not present

## 2024-06-25 DIAGNOSIS — C50412 Malignant neoplasm of upper-outer quadrant of left female breast: Secondary | ICD-10-CM | POA: Diagnosis not present

## 2024-06-25 DIAGNOSIS — Z171 Estrogen receptor negative status [ER-]: Secondary | ICD-10-CM | POA: Diagnosis not present

## 2024-07-01 DIAGNOSIS — C50412 Malignant neoplasm of upper-outer quadrant of left female breast: Secondary | ICD-10-CM | POA: Diagnosis not present

## 2024-07-01 DIAGNOSIS — Z171 Estrogen receptor negative status [ER-]: Secondary | ICD-10-CM | POA: Diagnosis not present

## 2024-07-26 DIAGNOSIS — L03031 Cellulitis of right toe: Secondary | ICD-10-CM | POA: Diagnosis not present

## 2024-07-26 DIAGNOSIS — Z6826 Body mass index (BMI) 26.0-26.9, adult: Secondary | ICD-10-CM | POA: Diagnosis not present

## 2024-07-26 DIAGNOSIS — R52 Pain, unspecified: Secondary | ICD-10-CM | POA: Diagnosis not present

## 2024-07-26 DIAGNOSIS — Z299 Encounter for prophylactic measures, unspecified: Secondary | ICD-10-CM | POA: Diagnosis not present

## 2024-08-03 DIAGNOSIS — I10 Essential (primary) hypertension: Secondary | ICD-10-CM | POA: Diagnosis not present

## 2024-08-03 DIAGNOSIS — F419 Anxiety disorder, unspecified: Secondary | ICD-10-CM | POA: Diagnosis not present

## 2024-08-03 DIAGNOSIS — Z299 Encounter for prophylactic measures, unspecified: Secondary | ICD-10-CM | POA: Diagnosis not present

## 2024-08-03 DIAGNOSIS — E7800 Pure hypercholesterolemia, unspecified: Secondary | ICD-10-CM | POA: Diagnosis not present

## 2024-08-03 DIAGNOSIS — R5383 Other fatigue: Secondary | ICD-10-CM | POA: Diagnosis not present

## 2024-08-03 DIAGNOSIS — Z0000 Encounter for general adult medical examination without abnormal findings: Secondary | ICD-10-CM | POA: Diagnosis not present

## 2024-08-03 DIAGNOSIS — Z79899 Other long term (current) drug therapy: Secondary | ICD-10-CM | POA: Diagnosis not present

## 2024-08-03 DIAGNOSIS — E119 Type 2 diabetes mellitus without complications: Secondary | ICD-10-CM | POA: Diagnosis not present

## 2024-08-19 DIAGNOSIS — Z95828 Presence of other vascular implants and grafts: Secondary | ICD-10-CM | POA: Diagnosis not present

## 2024-08-25 ENCOUNTER — Ambulatory Visit: Admitting: Internal Medicine

## 2024-08-25 ENCOUNTER — Encounter: Payer: Self-pay | Admitting: Internal Medicine

## 2024-08-26 ENCOUNTER — Ambulatory Visit: Attending: Internal Medicine | Admitting: Internal Medicine

## 2024-08-26 VITALS — BP 139/68 | HR 75 | Ht 65.0 in | Wt 153.0 lb

## 2024-08-26 DIAGNOSIS — Z01810 Encounter for preprocedural cardiovascular examination: Secondary | ICD-10-CM | POA: Diagnosis not present

## 2024-08-26 DIAGNOSIS — I493 Ventricular premature depolarization: Secondary | ICD-10-CM | POA: Diagnosis not present

## 2024-08-26 DIAGNOSIS — E785 Hyperlipidemia, unspecified: Secondary | ICD-10-CM | POA: Insufficient documentation

## 2024-08-26 DIAGNOSIS — R9439 Abnormal result of other cardiovascular function study: Secondary | ICD-10-CM | POA: Diagnosis not present

## 2024-08-26 DIAGNOSIS — Z8774 Personal history of (corrected) congenital malformations of heart and circulatory system: Secondary | ICD-10-CM | POA: Diagnosis not present

## 2024-08-26 DIAGNOSIS — R079 Chest pain, unspecified: Secondary | ICD-10-CM

## 2024-08-26 MED ORDER — METOPROLOL TARTRATE 25 MG PO TABS: 25.0000 mg | ORAL_TABLET | Freq: Two times a day (BID) | ORAL | 3 refills | Status: AC

## 2024-08-26 NOTE — Progress Notes (Signed)
 Cardiology Office Note  Date: 08/26/2024   ID: Beverly Contreras, DOB May 20, 1956, MRN [id number]  PCP:  [name], MD  Cardiologist:  None Electrophysiologist:  None   History of Present Illness: Beverly Contreras is a 68 y.o. female  Referred to cardiology clinic for evaluation of chest pain.  EKG today showed NSR, frequent PVCs.  Patient reported that she was sent to cardiology clinic mainly for irregular heartbeat.  She was never diagnosed with atrial arrhythmias in the past.  She reported having palpitations, feels like her heart skipping a beat for a long time but worsened in the last few months.  She also noticed new symptoms of sharp chest pains (couple of times a week and does not last long), dizziness, some SOB.  No syncope.  No leg swelling.  Does not have OSA.  Patient has a history of ??  ASD repair in 1969 at [facility]  She reported that she underwent surgery for hole in the heart and does not know further details.  Patient underwent echocardiogram, NM stress test, LHC and cardiac event monitor in 2017 and 2018.  LHC showed angiographically normal coronaries.  Echocardiogram showed normal LV function, bowing and restricted motion of the left coronary cusp with no obvious aortic valve stenosis and mild AR, increased echogenicity across the atrial septum and cannot exclude a trivial degree of left-to-right shunt through PFO, seen only intermittently by color Doppler, no evidence of large ASD.  Cardiac event monitor was unremarkable at the time.  Past Medical History:  Diagnosis Date   Anxiety    Attention deficit disorder    Depression    Diabetes mellitus without complication (HCC)    Fibromyalgia    Hyperlipidemia    Vitamin D deficiency     Past Surgical History:  Procedure Laterality Date   CARDIAC SURGERY  1969   [facility], reportedly to repair hole in heart   LEFT HEART CATH AND CORONARY ANGIOGRAPHY N/A 06/18/2017   Procedure: LEFT HEART CATH AND  CORONARY ANGIOGRAPHY;  Surgeon: Verlin Lonni BIRCH, MD;  Location: [facility];  Service: Cardiovascular;  Laterality: N/A;   TUBAL LIGATION      Current Outpatient Medications  Medication Sig Dispense Refill   atorvastatin (LIPITOR) 10 MG tablet Take 10 mg by mouth Beverly.     b complex vitamins capsule Take 1 capsule by mouth every morning.     diclofenac  (VOLTAREN ) 50 MG EC tablet Take 1 tablet (50 mg total) by mouth 2 (two) times Beverly. 20 tablet 0   FLUoxetine  (PROZAC ) 20 MG capsule Take 1 capsule (20 mg total) by mouth Beverly. 30 capsule 2   metFORMIN (GLUCOPHAGE) 500 MG tablet Take 500 mg by mouth Beverly.     methocarbamol  (ROBAXIN ) 500 MG tablet Take 1 tablet (500 mg total) by mouth 4 (four) times Beverly. 20 tablet 0   pregabalin (LYRICA) 50 MG capsule as needed (pain).     tiZANidine (ZANAFLEX) 4 MG tablet Take 4 mg by mouth at bedtime.      zinc gluconate 50 MG tablet Take 50 mg by mouth Beverly.     clonazePAM  (KLONOPIN ) 0.5 MG tablet Take 1 tablet (0.5 mg total) by mouth as needed for anxiety. (Patient not taking: Reported on 08/26/2024) 30 tablet 2   methylphenidate  (RITALIN ) 10 MG tablet Take 1 tablet (10 mg total) by mouth 2 (two) times Beverly with breakfast and lunch. (Patient not taking: Reported on 08/26/2024) 60 tablet 0  No current facility-administered medications for this visit.   Allergies:  Levaquin [levofloxacin in d5w]   Social History: The patient  reports that she has never smoked. She has never used smokeless tobacco. She reports that she does not drink alcohol  and does not use drugs.   Family History: The patient's family history includes ADD / ADHD in an other family member; Alcohol  abuse in her brother and brother; Bipolar disorder in her daughter; Drug abuse in her brother, brother and another family member; Heart disease in her father; Hypertension in her father; Schizophrenia in her brother; Valvular heart disease in her mother.   ROS:  Please see  the history of present illness. Otherwise, complete review of systems is positive for none  All other systems are reviewed and negative.   Physical Exam: VS:  BP 139/68 (BP Location: Right Arm)   Pulse 75   Ht 5' 5 (1.651 m)   Wt 153 lb (69.4 kg)   SpO2 95%   BMI 25.46 kg/m , BMI Body mass index is 25.46 kg/m.  Wt Readings from Last 3 Encounters:  08/26/24 153 lb (69.4 kg)  12/17/19 165 lb (74.8 kg)  07/07/17 169 lb 12.8 oz (77 kg)    General: Patient appears comfortable at rest. HEENT: Conjunctiva and lids normal, oropharynx clear with moist mucosa. Neck: Supple, no elevated JVP or carotid bruits, no thyromegaly. Lungs: Clear to auscultation, nonlabored breathing at rest. Cardiac: Regular rate and rhythm, no S3 or significant systolic murmur, no pericardial rub. Abdomen: Soft, nontender, no hepatomegaly, bowel sounds present, no guarding or rebound. Extremities: No pitting edema, distal pulses 2+. Skin: Warm and dry. Musculoskeletal: No kyphosis. Neuropsychiatric: Alert and oriented x3, affect grossly appropriate.  Recent Labwork: No results found for requested labs within last 365 days.  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 - EKG today showed NSR, frequent PVCs with origin from RVOT - Start metoprolol tartrate 25 mg twice Beverly. - Prior LHC from 2018 showed angiographically normal coronaries.  Obtain CT cardiac (after 1 month, hopefully PVCs are adequately suppressed by then) for ischemia evaluation.  If no CAD, she will need cardiac MRI for further evaluation.  Cardiac risk factors include DM 2. - Obtain echocardiogram. - Does not have OSA symptoms.  History of ?? ASD repair in 1969 at [facility] - Trivial/residual shunt across the atrial septum in 2017.  Updating echocardiogram, as above.  HLD,  unknown values - Continue atorvastatin 10 mg nightly.  Goal LDL less than 100.  10 minutes spent in reviewing prior records.  10 minutes spent in documentation.  15 minutes spent with the patient in discussing the above problems.    Medication Adjustments/Labs and Tests Ordered: Current medicines are reviewed at length with the patient today.  Concerns regarding medicines are outlined above.    Disposition:  Follow up 3 months  Signed Luie Laneve Priya Rayelle Armor, MD, 08/26/2024 1:26 PM    [facility] at [facility] [address] [place], [place], [place] [address]

## 2024-08-26 NOTE — Patient Instructions (Addendum)
 Medication Instructions:  Your physician has recommended you make the following change in your medication:  Start taking Metoprolol Tartrate 25 mg twice daily Continue taking all other medications as prescribed  Labwork: BMET in 1-2 weeks prior to Coronary CTA at [facility] [place]  Testing/Procedures: Your physician has requested that you have an echocardiogram. Echocardiography is a painless test that uses sound waves to create images of your heart. It provides your doctor with information about the size and shape of your heart and how well your hearts chambers and valves are working. This procedure takes approximately one hour. There are no restrictions for this procedure. Please do NOT wear cologne, perfume, aftershave, or lotions (deodorant is allowed). Please arrive 15 minutes prior to your appointment time.  Please note: We ask at that you not bring children with you during ultrasound (echo/ vascular) testing. Due to room size and safety concerns, children are not allowed in the ultrasound rooms during exams. Our front office staff cannot provide observation of children in our lobby area while testing is being conducted. An adult accompanying a patient to their appointment will only be allowed in the ultrasound room at the discretion of the ultrasound technician under special circumstances. We apologize for any inconvenience.     Your cardiac CT will be scheduled at one of the below locations:   [facility] [address] [place], [place] [address] [phone] (Severe contrast allergies only)  OR   [facility] [address]  [place], [place] [address]    If scheduled at [facility], please arrive at the [facility] and Children's Entrance (Entrance C2) of [facility] 30 minutes prior to test start time. You can use the FREE valet parking offered at entrance C (encouraged to control the heart rate for the test)   Proceed to the [facility] Radiology Department (first floor) to check-in and test prep.  All radiology patients and guests should use entrance C2 at [facility], accessed from [facility], even though the hospital's physical address listed is [address]  If scheduled at the Heart and Vascular Tower at [address], please enter the parking lot using the [address] entrance and use the FREE valet service at the patient drop-off area. Enter the building and check-in with registration on the main floor.  There is spacious parking and easy access to the radiology department from the [facility] Heart and Vascular entrance. Please enter here and check-in with the desk attendant.   If scheduled at [facility], please arrive 30 minutes early for check-in and test prep.  Please follow these instructions carefully (unless otherwise directed):  An IV will be required for this test and Nitroglycerin will be given.    On the Night Before the Test: Be sure to Drink plenty of water. Do not consume any caffeinated/decaffeinated beverages or chocolate 12 hours prior to your test. Do not take any antihistamines 12 hours prior to your test.  If the patient has contrast allergy: No allergy  On the Day of the Test: Drink plenty of water until 1 hour prior to the test. Do not eat any food 1 hour prior to test. You may take your regular medications prior to the test.  Take metoprolol 100 mg (Lopressor) two hours prior to test. (4 tablets of the 25 mg) If you take Furosemide/Hydrochlorothiazide/Spironolactone/Chlorthalidone, please HOLD on the morning of the test. Patients who wear a continuous glucose monitor MUST remove  the device prior to scanning. FEMALES- please wear underwire-free bra if available, avoid dresses & tight clothing       After the Test: Drink plenty of water. After receiving IV contrast, you may experience a mild flushed feeling. This is  normal. On occasion, you may experience a mild rash up to 24 hours after the test. This is not dangerous. If this occurs, you can take Benadryl 25 mg, Zyrtec, Claritin, or Allegra and increase your fluid intake. (Patients taking Tikosyn should avoid Benadryl, and may take Zyrtec, Claritin, or Allegra) If you experience trouble breathing, this can be serious. If it is severe call [phone] IMMEDIATELY. If it is mild, please call our office.  We will call to schedule your test 2-4 weeks out understanding that some insurance companies will need an authorization prior to the service being performed.   For more information and frequently asked questions, please visit our website : [email or web address]  For non-scheduling related questions, please contact the cardiac imaging nurse navigator should you have any questions/concerns: Cardiac Imaging Nurse Navigators Direct Office Dial: [phone]   For scheduling needs, including cancellations and rescheduling, please call Brittany, [phone].   Follow-Up: Your physician recommends that you schedule a follow-up appointment in: 3 months  Any Other Special Instructions Will Be Listed Below (If Applicable). Thank you for choosing [facility]!     If you need a refill on your cardiac medications before your next appointment, please call your pharmacy.

## 2024-09-07 ENCOUNTER — Ambulatory Visit

## 2024-09-10 ENCOUNTER — Telehealth (HOSPITAL_COMMUNITY): Payer: Self-pay | Admitting: Emergency Medicine

## 2024-09-10 NOTE — Telephone Encounter (Signed)
 Attempted to call patient regarding upcoming cardiac CT appointment. Left message on voicemail with name and callback number Rockwell Alexandria RN Navigator Cardiac Imaging [facility] [phone] Office [phone] Cell

## 2024-09-13 ENCOUNTER — Ambulatory Visit (HOSPITAL_COMMUNITY): Admission: RE | Admit: 2024-09-13 | Source: Ambulatory Visit

## 2024-09-15 ENCOUNTER — Ambulatory Visit

## 2024-09-15 DIAGNOSIS — I493 Ventricular premature depolarization: Secondary | ICD-10-CM | POA: Diagnosis not present

## 2024-09-15 DIAGNOSIS — R079 Chest pain, unspecified: Secondary | ICD-10-CM | POA: Diagnosis not present

## 2024-09-15 LAB — ECHOCARDIOGRAM COMPLETE
AR max vel: 1.46 cm2
AV Peak grad: 13.1 mmHg
Ao pk vel: 1.81 m/s
Area-P 1/2: 4.17 cm2
Calc EF: 46.8 %
P 1/2 time: 662 ms
S' Lateral: 3.7 cm
Single Plane A2C EF: 47 %
Single Plane A4C EF: 45.7 %

## 2024-09-20 ENCOUNTER — Ambulatory Visit: Payer: Self-pay | Admitting: Internal Medicine

## 2024-09-20 DIAGNOSIS — Z01810 Encounter for preprocedural cardiovascular examination: Secondary | ICD-10-CM

## 2024-09-20 DIAGNOSIS — I493 Ventricular premature depolarization: Secondary | ICD-10-CM

## 2024-09-23 ENCOUNTER — Telehealth (HOSPITAL_COMMUNITY): Payer: Self-pay | Admitting: *Deleted

## 2024-09-23 ENCOUNTER — Other Ambulatory Visit (HOSPITAL_COMMUNITY)
Admission: RE | Admit: 2024-09-23 | Discharge: 2024-09-23 | Disposition: A | Discharge status: Home / Self Care | Source: Ambulatory Visit | Attending: Internal Medicine | Admitting: Internal Medicine

## 2024-09-23 DIAGNOSIS — Z01810 Encounter for preprocedural cardiovascular examination: Secondary | ICD-10-CM | POA: Diagnosis present

## 2024-09-23 LAB — BASIC METABOLIC PANEL WITH GFR
Anion gap: 12 (ref 5–15)
BUN: 22 mg/dL (ref 8–23)
CO2: 23 mmol/L (ref 22–32)
Calcium: 9.4 mg/dL (ref 8.9–10.3)
Chloride: 104 mmol/L (ref 98–111)
Creatinine, Ser: 1.06 mg/dL — ABNORMAL HIGH (ref 0.44–1.00)
GFR, Estimated: 57 mL/min — ABNORMAL LOW
Glucose, Bld: 146 mg/dL — ABNORMAL HIGH (ref 70–99)
Potassium: 4.3 mmol/L (ref 3.5–5.1)
Sodium: 139 mmol/L (ref 135–145)

## 2024-09-23 NOTE — Telephone Encounter (Signed)
 Reaching out to patient to offer assistance regarding upcoming cardiac imaging study; pt verbalizes understanding of appt date/time, parking situation and where to check in, pre-test NPO status and medications ordered, and verified current allergies; name and call back number provided for further questions should they arise Sid Seats RN Navigator Cardiac Imaging [facility] [phone] office [phone] cell

## 2024-09-24 ENCOUNTER — Ambulatory Visit (HOSPITAL_COMMUNITY)
Admission: RE | Admit: 2024-09-24 | Discharge: 2024-09-24 | Disposition: A | Source: Ambulatory Visit | Attending: Cardiology | Admitting: Cardiology

## 2024-09-24 ENCOUNTER — Other Ambulatory Visit (HOSPITAL_COMMUNITY)

## 2024-09-24 VITALS — BP 129/74 | HR 59

## 2024-09-24 DIAGNOSIS — I700 Atherosclerosis of aorta: Secondary | ICD-10-CM | POA: Diagnosis not present

## 2024-09-24 DIAGNOSIS — I2510 Atherosclerotic heart disease of native coronary artery without angina pectoris: Secondary | ICD-10-CM

## 2024-09-24 DIAGNOSIS — E785 Hyperlipidemia, unspecified: Secondary | ICD-10-CM | POA: Diagnosis present

## 2024-09-24 DIAGNOSIS — I493 Ventricular premature depolarization: Secondary | ICD-10-CM | POA: Insufficient documentation

## 2024-09-24 DIAGNOSIS — R079 Chest pain, unspecified: Secondary | ICD-10-CM | POA: Diagnosis present

## 2024-09-24 MED ORDER — NITROGLYCERIN 0.4 MG SL SUBL
0.8000 mg | SUBLINGUAL_TABLET | Freq: Once | SUBLINGUAL | Status: AC
  Administered 2024-09-24: 0.8 mg via SUBLINGUAL

## 2024-09-24 MED ORDER — IOHEXOL 350 MG/ML SOLN
100.0000 mL | Freq: Once | INTRAVENOUS | Status: AC | PRN
  Administered 2024-09-24: 100 mL via INTRAVENOUS

## 2024-09-28 ENCOUNTER — Encounter: Payer: Self-pay | Admitting: Internal Medicine

## 2024-09-28 NOTE — Telephone Encounter (Signed)
 Error

## 2024-09-28 NOTE — Telephone Encounter (Signed)
 The patient has been notified of the result and verbalized understanding.  All questions (if any) were answered. Patient will come by the office and pick up instructions and lab order Beverly Contreras, CMA 09/28/2024 1:13 PM

## 2024-09-28 NOTE — Telephone Encounter (Signed)
 Patient is returning call to get results.

## 2024-09-28 NOTE — Telephone Encounter (Signed)
-----   Message from Vishnu Mallipeddi, MD sent at 09/27/2024 11:15 AM EST ----- Coronary calcium score is 25.9, 58 th percentile for age and sex matched control.  Minimal nonobstructive CAD is present.  Obtain cardiac MRI to rule out myocardial scar (diagnosis will be PVCs).

## 2024-10-22 ENCOUNTER — Other Ambulatory Visit (HOSPITAL_COMMUNITY): Admission: RE | Admit: 2024-10-22 | Source: Ambulatory Visit

## 2024-10-22 LAB — CBC
HCT: 42 % (ref 36.0–46.0)
Hemoglobin: 14.1 g/dL (ref 12.0–15.0)
MCH: 28.8 pg (ref 26.0–34.0)
MCHC: 33.6 g/dL (ref 30.0–36.0)
MCV: 85.9 fL (ref 80.0–100.0)
Platelets: 152 10*3/uL (ref 150–400)
RBC: 4.89 MIL/uL (ref 3.87–5.11)
RDW: 13.2 % (ref 11.5–15.5)
WBC: 5.5 10*3/uL (ref 4.0–10.5)
nRBC: 0 % (ref 0.0–0.2)

## 2024-10-26 ENCOUNTER — Ambulatory Visit (HOSPITAL_COMMUNITY)

## 2024-12-31 ENCOUNTER — Ambulatory Visit: Payer: Self-pay | Admitting: Internal Medicine
